# Patient Record
Sex: Female | Born: 1970 | Race: Black or African American | Hispanic: No | Marital: Married | Smoking: Never smoker
Health system: Southern US, Community
[De-identification: ages and names within clinical notes are randomized; demographics above are authoritative.]

## PROBLEM LIST (undated history)

## (undated) DIAGNOSIS — I10 Essential (primary) hypertension: Secondary | ICD-10-CM

## (undated) DIAGNOSIS — F32A Depression, unspecified: Secondary | ICD-10-CM

## (undated) DIAGNOSIS — E7439 Other disorders of intestinal carbohydrate absorption: Secondary | ICD-10-CM

## (undated) DIAGNOSIS — J45909 Unspecified asthma, uncomplicated: Secondary | ICD-10-CM

## (undated) DIAGNOSIS — J310 Chronic rhinitis: Secondary | ICD-10-CM

## (undated) DIAGNOSIS — J302 Other seasonal allergic rhinitis: Secondary | ICD-10-CM

## (undated) DIAGNOSIS — R7303 Prediabetes: Secondary | ICD-10-CM

## (undated) DIAGNOSIS — E669 Obesity, unspecified: Secondary | ICD-10-CM

## (undated) DIAGNOSIS — M1990 Unspecified osteoarthritis, unspecified site: Secondary | ICD-10-CM

## (undated) DIAGNOSIS — K219 Gastro-esophageal reflux disease without esophagitis: Secondary | ICD-10-CM

## (undated) HISTORY — DX: Depression, unspecified: F32.A

## (undated) HISTORY — DX: Chronic rhinitis: J31.0

## (undated) HISTORY — DX: Gastro-esophageal reflux disease without esophagitis: K21.9

## (undated) HISTORY — DX: Prediabetes: R73.03

## (undated) HISTORY — DX: Essential (primary) hypertension: I10

## (undated) HISTORY — DX: Other disorders of intestinal carbohydrate absorption: E74.39

## (undated) HISTORY — DX: Obesity, unspecified: E66.9

## (undated) HISTORY — PX: MYOMECTOMY: SHX85

## (undated) HISTORY — PX: WISDOM TOOTH EXTRACTION: SHX21

---

## 2006-06-21 ENCOUNTER — Other Ambulatory Visit: Admission: RE | Admit: 2006-06-21 | Discharge: 2006-06-21 | Payer: Self-pay | Admitting: Internal Medicine

## 2012-05-24 ENCOUNTER — Other Ambulatory Visit (HOSPITAL_COMMUNITY): Payer: Self-pay | Admitting: Family Medicine

## 2012-05-24 DIAGNOSIS — Z1231 Encounter for screening mammogram for malignant neoplasm of breast: Secondary | ICD-10-CM

## 2012-06-05 ENCOUNTER — Other Ambulatory Visit: Payer: Self-pay | Admitting: Family Medicine

## 2012-06-05 DIAGNOSIS — Z1231 Encounter for screening mammogram for malignant neoplasm of breast: Secondary | ICD-10-CM

## 2012-07-11 ENCOUNTER — Ambulatory Visit
Admission: RE | Admit: 2012-07-11 | Discharge: 2012-07-11 | Disposition: A | Discharge status: Home / Self Care | Payer: BC Managed Care – PPO | Source: Ambulatory Visit | Attending: Family Medicine | Admitting: Family Medicine

## 2012-07-11 DIAGNOSIS — Z1231 Encounter for screening mammogram for malignant neoplasm of breast: Secondary | ICD-10-CM

## 2012-07-13 ENCOUNTER — Emergency Department (HOSPITAL_BASED_OUTPATIENT_CLINIC_OR_DEPARTMENT_OTHER): Payer: BC Managed Care – PPO

## 2012-07-13 ENCOUNTER — Encounter (HOSPITAL_BASED_OUTPATIENT_CLINIC_OR_DEPARTMENT_OTHER): Payer: Self-pay | Admitting: *Deleted

## 2012-07-13 ENCOUNTER — Emergency Department (HOSPITAL_BASED_OUTPATIENT_CLINIC_OR_DEPARTMENT_OTHER)
Admission: EM | Admit: 2012-07-13 | Discharge: 2012-07-13 | Disposition: A | Discharge status: Home / Self Care | Payer: BC Managed Care – PPO | Attending: Emergency Medicine | Admitting: Emergency Medicine

## 2012-07-13 DIAGNOSIS — J45901 Unspecified asthma with (acute) exacerbation: Secondary | ICD-10-CM | POA: Insufficient documentation

## 2012-07-13 HISTORY — DX: Unspecified asthma, uncomplicated: J45.909

## 2012-07-13 MED ORDER — FLUTICASONE PROPIONATE 50 MCG/ACT NA SUSP: 2.0000 | Freq: Every day | NASAL | Status: DC

## 2012-07-13 MED ORDER — FLUTICASONE PROPIONATE HFA 110 MCG/ACT IN AERO: 1.0000 | INHALATION_SPRAY | Freq: Two times a day (BID) | RESPIRATORY_TRACT | Status: DC

## 2012-07-13 MED ORDER — ALBUTEROL SULFATE (5 MG/ML) 0.5% IN NEBU
INHALATION_SOLUTION | RESPIRATORY_TRACT | Status: AC
  Administered 2012-07-13: 5 mg
  Filled 2012-07-13: qty 1

## 2012-07-13 MED ORDER — IPRATROPIUM BROMIDE 0.02 % IN SOLN
RESPIRATORY_TRACT | Status: AC
  Administered 2012-07-13: 0.5 mg
  Filled 2012-07-13: qty 2.5

## 2012-07-13 MED ORDER — PREDNISONE 50 MG PO TABS: 50.0000 mg | ORAL_TABLET | Freq: Every day | ORAL | Status: DC

## 2012-07-13 NOTE — ED Provider Notes (Signed)
History  This chart was scribed for Joia Doyle Smitty Cords, MD by Shari Heritage. The patient was seen in room MH06/MH06. Patient's care was started at 1950.     CSN: [id number]  Arrival date & time 07/13/12  1845   First MD Initiated Contact with Patient 07/13/12 1950      Chief Complaint  Patient presents with  . Shortness of Breath    Patient is a 41 y.o. female presenting with shortness of breath. The history is provided by the patient. No language interpreter was used.  Shortness of Breath  The current episode started today. The onset was gradual. The problem occurs rarely. The problem has been resolved. The problem is moderate. The symptoms are relieved by beta-agonist inhalers. Nothing aggravates the symptoms. Associated symptoms include rhinorrhea, cough, shortness of breath and wheezing. Pertinent negatives include no chest pain, no chest pressure, no orthopnea, no fever and no sore throat. Associated symptoms comments: Nasal congestion. The cough is productive. Nothing relieves the cough. The rhinorrhea has been occurring continuously. There was no intake of a foreign body. She has not inhaled smoke recently. She has had no prior steroid use. Her past medical history is significant for asthma. She has been behaving normally.    HPI Comments There is associated congestion, rhinorrhea and productive cough. Patient denies chills, fever, nausea, vomiting, arm pain, shoulder pain, chest pain, edema in legs or pain in legs bilaterally. Patient states that she has been experiencing cough and congestion since Wednesday. Today, she suddenly began feeling SOB and experiencing chest tightness, but states that these symptoms are typical of her reactive airway disease. SOB was resolved in the ED with one breathing treatment. Patient also used her albuterol inhaler at home 3x PTA without relief. Patient has take no long car or plane trips. She has never smoked Wells score 0.   Patient states this is  typical of her previous asthma attacks.   PCP - Cam H[facility]   Past Medical History  Diagnosis Date  . Asthma     History reviewed. No pertinent past surgical history.  History reviewed. No pertinent family history.  History  Substance Use Topics  . Smoking status: Never Smoker   . Smokeless tobacco: Not on file  . Alcohol Use: Yes    OB History    Grav Para Term Preterm Abortions TAB SAB Ect Mult Living                  Review of Systems  Constitutional: Negative for fever.  HENT: Positive for congestion and rhinorrhea. Negative for sore throat.   Respiratory: Positive for cough, shortness of breath and wheezing. Negative for chest tightness.   Cardiovascular: Negative for chest pain, palpitations, orthopnea and leg swelling.  All other systems reviewed and are negative.    Allergies  Hydrocodone  Home Medications   Current Outpatient Rx  Name Route Sig Dispense Refill  . ALBUTEROL SULFATE HFA 108 (90 BASE) MCG/ACT IN AERS Inhalation Inhale 2 puffs into the lungs every 6 (six) hours as needed.    Kathrynn Running ESTRAD-FE BIPHAS 1 MG-10 MCG / 10 MCG PO TABS Oral Take 1 tablet by mouth daily.      BP 161/93  Pulse 112  Temp 98 F (36.7 C) (Oral)  Resp 24  Ht 5' 6.5" (1.689 m)  Wt 180 lb (81.647 kg)  BMI 28.62 kg/m2  SpO2 100%  LMP 06/22/2012  Physical Exam  Constitutional: She is oriented to person, place, and  time. She appears well-developed and well-nourished.  HENT:  Head: Normocephalic and atraumatic.  Mouth/Throat: Oropharynx is clear and moist.       Cobblestoning at the back of the throat. Postnasal drip present.  Eyes: EOM are normal. Pupils are equal, round, and reactive to light.  Neck: Normal range of motion. Neck supple. No JVD present.  Cardiovascular: Normal rate and regular rhythm.   No murmur heard. Pulmonary/Chest: Effort normal. No stridor. No respiratory distress. She has wheezes (faint wheeze). She has no  rhonchi. She has no rales.  Abdominal: Soft. Bowel sounds are normal. There is no tenderness. There is no rebound and no guarding.  Musculoskeletal: Normal range of motion. She exhibits no edema and no tenderness.  Neurological: She is alert and oriented to person, place, and time.  Skin: Skin is warm.  Psychiatric: She has a normal mood and affect. Her behavior is normal.    ED Course  Procedures (including critical care time) DIAGNOSTIC STUDIES: Oxygen Saturation is 97% on room air, adequate by my interpretation.    COORDINATION OF CARE: 8:20pm- Patient informed of current plan for treatment and evaluation and agrees with plan at this time.      Labs Reviewed - No data to display No results found.   No diagnosis found.    MDM  Well's score is 0.  Patient states these symptoms are clearly her asthma.  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  She is advised to return for chest pain, pleuritic or pressure like.  Shortness of breath or any concerns.  Follow up with PMD on Monday.  Patient verbalizes understanding and agrees to follow up     I personally performed the services described in this documentation, which was scribed in my presence. The recorded information has been reviewed and considered.    Jasmine Awe, MD 07/14/12 [phone]

## 2012-07-13 NOTE — ED Notes (Signed)
Cough-prod with greenish, yellow sputum, congestion since Wed. Increased SHOB past few hours. Chest feels tight. Tried inh without relief. BBS-rhonchi and wheezing. Diminished. Mary, RRT to triage to assess.

## 2014-12-08 ENCOUNTER — Ambulatory Visit: Payer: BLUE CROSS/BLUE SHIELD

## 2015-08-04 ENCOUNTER — Other Ambulatory Visit: Payer: Self-pay | Admitting: Obstetrics & Gynecology

## 2015-08-04 ENCOUNTER — Other Ambulatory Visit (HOSPITAL_COMMUNITY)
Admission: RE | Admit: 2015-08-04 | Discharge: 2015-08-04 | Disposition: A | Discharge status: Home / Self Care | Payer: BLUE CROSS/BLUE SHIELD | Source: Ambulatory Visit | Attending: Obstetrics & Gynecology | Admitting: Obstetrics & Gynecology

## 2015-08-04 DIAGNOSIS — Z1151 Encounter for screening for human papillomavirus (HPV): Secondary | ICD-10-CM | POA: Diagnosis not present

## 2015-08-04 DIAGNOSIS — Z01411 Encounter for gynecological examination (general) (routine) with abnormal findings: Secondary | ICD-10-CM | POA: Diagnosis present

## 2015-08-06 LAB — CYTOLOGY - PAP

## 2015-08-23 ENCOUNTER — Other Ambulatory Visit: Payer: Self-pay | Admitting: Obstetrics & Gynecology

## 2015-08-31 ENCOUNTER — Other Ambulatory Visit: Payer: Self-pay

## 2015-08-31 DIAGNOSIS — Z1231 Encounter for screening mammogram for malignant neoplasm of breast: Secondary | ICD-10-CM

## 2015-10-04 ENCOUNTER — Other Ambulatory Visit: Payer: Self-pay

## 2015-10-04 ENCOUNTER — Ambulatory Visit
Admission: RE | Admit: 2015-10-04 | Discharge: 2015-10-04 | Disposition: A | Discharge status: Home / Self Care | Payer: BLUE CROSS/BLUE SHIELD | Source: Ambulatory Visit

## 2015-10-04 DIAGNOSIS — Z1231 Encounter for screening mammogram for malignant neoplasm of breast: Secondary | ICD-10-CM

## 2016-03-20 ENCOUNTER — Encounter (HOSPITAL_COMMUNITY): Payer: Self-pay | Admitting: *Deleted

## 2016-04-06 NOTE — H&P (Signed)
45yo G0 who presents for hysteroscopy, D&C, myosure resection of uterine mass. In review, the patient reports a h/o heavy menstrual bleeding that originally was managed with OCPs until a few months ago when she was noted to have significant change in her bleeding.  In October, she was noted to have an very heavy period that lasted for over 2 weeks.  She had episodes of large clots and gushes where she would soak through super pads and require pad change every hour.  Management was performed with oral estrogen and a change in her OCPs.  As part of the work up both an [place] and TVUS were performed with the following findings: TVUS: 07/2015: 9.8 cm uterus with several small fibroids (largest 2.8cm) and 3cm complex mass within cavity. Normal ovaries. EMB 08/2015- benign proliferative endometrium To better assess the "complex mass" which initially was thought to be clot from the bleeding, an SHG was performed 6wks later for follow up.  SHG revealed : A hypoechoic mass in LUS ~ 2.5x2cm in size completely within endometrial cavity.  These findings are suggestive of either a polyp or suspected fibroid and pt wishes to proceed with surgical management as this is likely contributing to her abnormal uterine bleeding.  ROS:  CONSTITUTIONAL:  no Chills. no Fever. no Night sweats.  HEENT:  Blurrred vision no. no Double vision.  CARDIOLOGY:  no Chest pain.  RESPIRATORY:  no Shortness of breath. no Cough.  GASTROENTEROLOGY:  Abdominal pain yes, see HPI. no Appetite change. no Change in bowel movements.  FEMALE REPRODUCTIVE:  no Breast lumps or discharge. no Breast pain.  NEUROLOGY:  Dizziness yes, Occasional dizziness. no Headache. no Loss of consciousness.  PSYCHOLOGY:  no Depression. no Confusion.  SKIN:  no Rash. no Hives.  HEMATOLOGY/LYMPH:  Anemia yes. Fatigue yes. Using Blood Thinners no.        Medical History: Chronic Rhinitis, Asthma/ RAD, GYN care per [facility]).         Gyn  History:  Sexual activity currently sexually active.  Periods : see above LMP: June 9th Birth control:  Cryselle 0.3/30 Last pap smear date: 07/2015 H/O Abnormal pap smear Co2 laser for cervical dysplasia.  Denies H/O STD.        OB History:  Never been pregnant per patient.        Surgical History: Cervical laser .        Hospitalization/Major Diagnostic Procedure: None .        Family History: Father: deceased, deceased from [place] (not the same one as her brother), diagnosed with DM. Mother: alive, diagnosed with HTN. Brother 1: deceased, MVA.        Social History:  General:  Tobacco use  cigarettes: Never smoked Tobacco history last updated 08/04/2015 Alcohol: yes.  Caffeine: yes.  Exercise: yes, Generally working out 3-4 x week.  Marital Status: married.  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  no Tobacco Exposure.        Medications:  Mucinex DM Maximum Strength Ibuprofen 800 MG Tablet 1 tablet Orally once a day,  ProAir HFA(Albuterol Sulfate HFA) 108 (90 Base) MCG/ACT Aerosol Solution  Vitamin D (Cholecalciferol) 1000 UNIT Tablet 1 tablet Orally Once a day,  Vitamin B12 100 MCG Tablet Orally ,  Hair Skin & Nails Gummies(Biotin w/ Vitamins C & E) 1250-7.5-7.5 MCG-MG-UNT Tablet Chewable Orally , Taking Slow Fe(Iron) 160 (50 Fe) MG Tablet Extended Release 1 tablet Orally Once a day, Taking Zyrtec-D 12 hour(Cetirizine-Pseudoephedrine ER) 5-120 MG Tablet Extended Release  12 Hour 1 tablet Orally Twice a day, Medication List reviewed and reconciled with the patient       Allergies: Hydrocodone: Itch.    O:  SKIN: warm and dry, no rashes.  NECK: supple, normal appearance.  LUNGS: regular breathing rate and effort.  FEMALE GENITOURINARY: normal external genitalia, labia - unremarkable, vagina - pink moist mucosa, no lesions or abnormal discharge, cervix - no discharge or lesions or CMT.  MUSCULOSKELETAL no calf tenderness bilaterally.  EXTREMITIES: no edema present.   PSYCH appropriate mood and affect.   Labs:  6/13: CBC: 8.8>11.6/36.9<571  EMB 08/2015: negative, no hyperplasia or malginancy  TVUS/SHG: [place] report: 9.9cm uterus with 3 small fibroids- largest 2.8cm. Endometrium fluid fills with complex mass ~ 3.5cm in size, no BF noted. Thin endometrial walls, bilateral ovaries within normal limits.  SHG- hypoechoic mass in LUS ~ 2.5x2cm in size completely within endometrial cavity. no blood flow noted.  A/P: 45yo G0 who presents for hysteroscopy, D&C, myosure resection due to uterine mass and AUB -NPO -LR @ 125cc/hr -SCDs to OR -Risk/benefit and indications reviewed with pt- concerns were addressed and pt wishes to proceed.  Janyth Pupa, DO [phone] (pager) [phone] (office)

## 2016-04-13 ENCOUNTER — Encounter (HOSPITAL_COMMUNITY): Payer: Self-pay | Admitting: Anesthesiology

## 2016-04-14 ENCOUNTER — Encounter (HOSPITAL_COMMUNITY): Payer: Self-pay | Admitting: *Deleted

## 2016-04-14 ENCOUNTER — Ambulatory Visit (HOSPITAL_COMMUNITY)
Admission: RE | Admit: 2016-04-14 | Discharge: 2016-04-14 | Disposition: A | Discharge status: Home / Self Care | Payer: BLUE CROSS/BLUE SHIELD | Source: Ambulatory Visit | Attending: Obstetrics & Gynecology | Admitting: Obstetrics & Gynecology

## 2016-04-14 ENCOUNTER — Ambulatory Visit (HOSPITAL_COMMUNITY): Payer: BLUE CROSS/BLUE SHIELD | Admitting: Anesthesiology

## 2016-04-14 ENCOUNTER — Encounter (HOSPITAL_COMMUNITY)
Admission: RE | Disposition: A | Discharge status: Home / Self Care | Payer: Self-pay | Source: Ambulatory Visit | Attending: Obstetrics & Gynecology

## 2016-04-14 DIAGNOSIS — D259 Leiomyoma of uterus, unspecified: Secondary | ICD-10-CM | POA: Diagnosis not present

## 2016-04-14 DIAGNOSIS — N938 Other specified abnormal uterine and vaginal bleeding: Secondary | ICD-10-CM | POA: Insufficient documentation

## 2016-04-14 DIAGNOSIS — J45909 Unspecified asthma, uncomplicated: Secondary | ICD-10-CM | POA: Diagnosis not present

## 2016-04-14 HISTORY — DX: Unspecified osteoarthritis, unspecified site: M19.90

## 2016-04-14 HISTORY — DX: Other seasonal allergic rhinitis: J30.2

## 2016-04-14 HISTORY — PX: DILATATION & CURETTAGE/HYSTEROSCOPY WITH MYOSURE: SHX6511

## 2016-04-14 LAB — CBC
HCT: 33.8 % — ABNORMAL LOW (ref 36.0–46.0)
Hemoglobin: 10.8 g/dL — ABNORMAL LOW (ref 12.0–15.0)
MCH: 24.7 pg — AB (ref 26.0–34.0)
MCHC: 32 g/dL (ref 30.0–36.0)
MCV: 77.3 fL — ABNORMAL LOW (ref 78.0–100.0)
PLATELETS: 678 10*3/uL — AB (ref 150–400)
RBC: 4.37 MIL/uL (ref 3.87–5.11)
RDW: 14.5 % (ref 11.5–15.5)
WBC: 10.2 10*3/uL (ref 4.0–10.5)

## 2016-04-14 LAB — BASIC METABOLIC PANEL
ANION GAP: 10 (ref 5–15)
BUN: 10 mg/dL (ref 6–20)
CALCIUM: 8.9 mg/dL (ref 8.9–10.3)
CO2: 22 mmol/L (ref 22–32)
Chloride: 104 mmol/L (ref 101–111)
Creatinine, Ser: 0.73 mg/dL (ref 0.44–1.00)
GFR calc non Af Amer: >60 mL/min (ref >60)
GLUCOSE: 107 mg/dL — AB (ref 65–99)
POTASSIUM: 3.6 mmol/L (ref 3.5–5.1)
Sodium: 136 mmol/L (ref 135–145)

## 2016-04-14 LAB — PREGNANCY, URINE: Preg Test, Ur: NEGATIVE

## 2016-04-14 SURGERY — DILATATION & CURETTAGE/HYSTEROSCOPY WITH MYOSURE
Anesthesia: General | Site: Vagina

## 2016-04-14 MED ORDER — LIDOCAINE HCL (CARDIAC) 20 MG/ML IV SOLN
INTRAVENOUS | Status: DC | PRN
  Administered 2016-04-14: 100 mg via INTRAVENOUS

## 2016-04-14 MED ORDER — SCOPOLAMINE 1 MG/3DAYS TD PT72
MEDICATED_PATCH | TRANSDERMAL | Status: AC
  Filled 2016-04-14: qty 1

## 2016-04-14 MED ORDER — ACETAMINOPHEN 160 MG/5ML PO SOLN
975.0000 mg | Freq: Once | ORAL | Status: AC
  Administered 2016-04-14: 975 mg via ORAL

## 2016-04-14 MED ORDER — KETOROLAC TROMETHAMINE 30 MG/ML IJ SOLN
INTRAMUSCULAR | Status: DC | PRN
  Administered 2016-04-14: 30 mg via INTRAVENOUS

## 2016-04-14 MED ORDER — FENTANYL CITRATE (PF) 250 MCG/5ML IJ SOLN
INTRAMUSCULAR | Status: DC | PRN
  Administered 2016-04-14 (×5): 50 ug via INTRAVENOUS

## 2016-04-14 MED ORDER — DEXAMETHASONE SODIUM PHOSPHATE 10 MG/ML IJ SOLN
INTRAMUSCULAR | Status: AC
  Filled 2016-04-14: qty 1

## 2016-04-14 MED ORDER — SCOPOLAMINE 1 MG/3DAYS TD PT72
1.0000 | MEDICATED_PATCH | Freq: Once | TRANSDERMAL | Status: DC
  Administered 2016-04-14: 1.5 mg via TRANSDERMAL

## 2016-04-14 MED ORDER — KETOROLAC TROMETHAMINE 30 MG/ML IJ SOLN
INTRAMUSCULAR | Status: AC
  Filled 2016-04-14: qty 1

## 2016-04-14 MED ORDER — PROPOFOL 10 MG/ML IV BOLUS
INTRAVENOUS | Status: AC
  Filled 2016-04-14: qty 20

## 2016-04-14 MED ORDER — PROPOFOL 10 MG/ML IV BOLUS
INTRAVENOUS | Status: DC | PRN
  Administered 2016-04-14: 200 mg via INTRAVENOUS

## 2016-04-14 MED ORDER — VASOPRESSIN 20 UNIT/ML IV SOLN
INTRAVENOUS | Status: DC | PRN
  Administered 2016-04-14: 35 mL via INTRAMUSCULAR

## 2016-04-14 MED ORDER — ACETAMINOPHEN 160 MG/5ML PO SOLN
ORAL | Status: AC
  Filled 2016-04-14: qty 40.6

## 2016-04-14 MED ORDER — MIDAZOLAM HCL 2 MG/2ML IJ SOLN
INTRAMUSCULAR | Status: AC
  Filled 2016-04-14: qty 2

## 2016-04-14 MED ORDER — LACTATED RINGERS IV SOLN: INTRAVENOUS | Status: DC

## 2016-04-14 MED ORDER — ONDANSETRON HCL 4 MG/2ML IJ SOLN
INTRAMUSCULAR | Status: DC | PRN
  Administered 2016-04-14: 4 mg via INTRAVENOUS

## 2016-04-14 MED ORDER — GLYCOPYRROLATE 0.2 MG/ML IJ SOLN
INTRAMUSCULAR | Status: DC | PRN
  Administered 2016-04-14: 0.1 mg via INTRAVENOUS

## 2016-04-14 MED ORDER — LACTATED RINGERS IV SOLN
INTRAVENOUS | Status: DC
  Administered 2016-04-14: 09:00:00 via INTRAVENOUS

## 2016-04-14 MED ORDER — HYDROMORPHONE HCL 1 MG/ML IJ SOLN
INTRAMUSCULAR | Status: AC
  Filled 2016-04-14: qty 1

## 2016-04-14 MED ORDER — LIDOCAINE HCL 2 % EX GEL
CUTANEOUS | Status: AC
  Filled 2016-04-14: qty 5

## 2016-04-14 MED ORDER — SODIUM CHLORIDE 0.9 % IR SOLN
Status: DC | PRN
Start: 2016-04-14 — End: 2016-04-14
  Administered 2016-04-14: 3000 mL

## 2016-04-14 MED ORDER — ONDANSETRON HCL 4 MG/2ML IJ SOLN
INTRAMUSCULAR | Status: AC
  Filled 2016-04-14: qty 2

## 2016-04-14 MED ORDER — VASOPRESSIN 20 UNIT/ML IV SOLN
INTRAVENOUS | Status: AC
  Filled 2016-04-14: qty 1

## 2016-04-14 MED ORDER — DEXAMETHASONE SODIUM PHOSPHATE 4 MG/ML IJ SOLN
INTRAMUSCULAR | Status: AC
  Filled 2016-04-14: qty 1

## 2016-04-14 MED ORDER — FENTANYL CITRATE (PF) 100 MCG/2ML IJ SOLN: 25.0000 ug | INTRAMUSCULAR | Status: DC | PRN

## 2016-04-14 MED ORDER — MIDAZOLAM HCL 2 MG/2ML IJ SOLN
INTRAMUSCULAR | Status: DC | PRN
  Administered 2016-04-14: 2 mg via INTRAVENOUS

## 2016-04-14 MED ORDER — DEXAMETHASONE SODIUM PHOSPHATE 4 MG/ML IJ SOLN
INTRAMUSCULAR | Status: DC | PRN
  Administered 2016-04-14: 4 mg via INTRAVENOUS

## 2016-04-14 MED ORDER — LIDOCAINE HCL (CARDIAC) 20 MG/ML IV SOLN
INTRAVENOUS | Status: AC
  Filled 2016-04-14: qty 5

## 2016-04-14 MED ORDER — SODIUM CHLORIDE 0.9 % IJ SOLN
INTRAMUSCULAR | Status: AC
  Filled 2016-04-14: qty 100

## 2016-04-14 MED ORDER — FENTANYL CITRATE (PF) 250 MCG/5ML IJ SOLN
INTRAMUSCULAR | Status: AC
  Filled 2016-04-14: qty 5

## 2016-04-14 SURGICAL SUPPLY — 19 items
CANISTERS HI-FLOW 3000CC (CANNISTER) ×3 IMPLANT
CATH ROBINSON RED A/P 16FR (CATHETERS) ×3 IMPLANT
CLOTH BEACON ORANGE TIMEOUT ST (SAFETY) ×3 IMPLANT
CONTAINER PREFILL 10% NBF 60ML (FORM) ×6 IMPLANT
DEVICE MYOSURE LITE (MISCELLANEOUS) IMPLANT
DEVICE MYOSURE REACH (MISCELLANEOUS) IMPLANT
DILATOR CANAL MILEX (MISCELLANEOUS) IMPLANT
GLOVE BIOGEL PI IND STRL 6.5 (GLOVE) ×2 IMPLANT
GLOVE BIOGEL PI IND STRL 7.0 (GLOVE) ×1 IMPLANT
GLOVE BIOGEL PI INDICATOR 6.5 (GLOVE) ×4
GLOVE BIOGEL PI INDICATOR 7.0 (GLOVE) ×2
GLOVE ECLIPSE 6.5 STRL STRAW (GLOVE) ×3 IMPLANT
GOWN STRL REUS W/TWL LRG LVL3 (GOWN DISPOSABLE) ×6 IMPLANT
MYOSURE XL FIBROID REM (MISCELLANEOUS) ×3
PACK VAGINAL MINOR WOMEN LF (CUSTOM PROCEDURE TRAY) ×3 IMPLANT
PAD OB MATERNITY 4.3X12.25 (PERSONAL CARE ITEMS) ×3 IMPLANT
SEAL ROD LENS SCOPE MYOSURE (ABLATOR) ×6 IMPLANT
SYSTEM TISS REMOVAL MYSR XL RM (MISCELLANEOUS) ×1 IMPLANT
TOWEL OR 17X24 6PK STRL BLUE (TOWEL DISPOSABLE) ×6 IMPLANT

## 2016-04-14 NOTE — Interval H&P Note (Signed)
History and Physical Interval Note:  04/14/2016 9:36 AM  Miranda Hayden  has presented today for surgery, with the diagnosis of fibroid  The various methods of treatment have been discussed with the patient and family. After consideration of risks, benefits and other options for treatment, the patient has consented to  Procedure(s) with comments: [place] (N/A) - request case for Otila Kluver and Luellen Pucker and Elberta Leatherwood [facility]) confirmed and Mirena insertion as a surgical intervention .  The patient's history has been reviewed, patient examined, no change in status, stable for surgery.  I have reviewed the patient's chart and labs.  Questions were answered to the patient's satisfaction.     Janyth Pupa, M

## 2016-04-14 NOTE — Op Note (Signed)
Operative Report  PreOp: abnormal uterine bleeding, intracavitary uterine fibroid PostOp: same Procedure:  Hysteroscopy, Dilation and Curettage, myosure resection, Mirena replacement Surgeon: Dr. Janyth Pupa Anesthesia: General Complications:none EBL: [id number] UOP: 30cc IVF:1000cc Deficit: 310cc  Findings:9cm anteverted uterus with 2.5cm uterine fibroid within the cavity, both ostia visualized  Specimens: 1) endometrial fibroid 2) endometrial curettings   Procedure: The patient was taken to the operating room where she underwent general anesthesia without difficulty. The patient was placed in a low lithotomy position using Allen stirrups. The patient was examined with the findings as noted above.  She was then prepped and draped in the normal sterile fashion. The bladder was drained using a red rubber urethral catheter. A sterile speculum was inserted into the vagina. A single tooth tenaculum was placed on the anterior lip of the cervix. ~10cc of dilute vasopressin was injected into the cervix at 10, 2 and 6 o'clock position.  The uterus was then sounded to 9cm. The endocervical canal was then serially dilated to 20French using Hank dilators.  The diagnostic hysteroscope was then inserted without difficulty and noted to have the findings as listed above. Visualization was achieved using NS as a distending medium. The myosure was used for full resection of the intracavitary mass.  The hysteroscope was removed and sharp curettage was performed. The tissue was sent to pathology. The hysteroscope was reinserted no uterine perforations were seen and the cavity appeared normal. All instrument were then removed. Hemostasis was observed at the cervical site. The patient was repositioned to the supine position. The patient tolerated the procedure without any complications and taken to recovery in stable condition.   Janyth Pupa, DO [phone] (pager) [phone] (office)

## 2016-04-14 NOTE — Anesthesia Postprocedure Evaluation (Signed)
Anesthesia Post Note  Patient: Miranda Hayden  Procedure(s) Performed: Procedure(s) (LRB): DILATATION & CURETTAGE/HYSTEROSCOPY WITH MYOSURE (N/A)  Patient location during evaluation: PACU Anesthesia Type: General Level of consciousness: awake and alert Pain management: pain level controlled Vital Signs Assessment: post-procedure vital signs reviewed and stable Respiratory status: spontaneous breathing, nonlabored ventilation, respiratory function stable and patient connected to nasal cannula oxygen Cardiovascular status: blood pressure returned to baseline and stable Postop Assessment: no signs of nausea or vomiting Anesthetic complications: no     Last Vitals:  Filed Vitals:   04/14/16 1145 04/14/16 1220  BP: 142/81 154/98  Pulse: 75 79  Temp: 36.5 C 36.3 C  Resp: 24 20    Last Pain:  Filed Vitals:   04/14/16 1232  PainSc: 2    Pain Goal: Patients Stated Pain Goal: 3 (04/14/16 1220)               Tiajuana Amass

## 2016-04-14 NOTE — Anesthesia Procedure Notes (Signed)
Procedure Name: LMA Insertion Date/Time: 04/14/2016 9:58 AM Performed by: Flossie Dibble Pre-anesthesia Checklist: Patient identified, Timeout performed, Emergency Drugs available, Suction available and Patient being monitored Patient Re-evaluated:Patient Re-evaluated prior to inductionOxygen Delivery Method: Circle system utilized Preoxygenation: Pre-oxygenation with 100% oxygen Intubation Type: IV induction LMA: LMA inserted LMA Size: 4.0 Number of attempts: 1 Placement Confirmation: breath sounds checked- equal and bilateral and positive ETCO2 Tube secured with: Tape Dental Injury: Teeth and Oropharynx as per pre-operative assessment

## 2016-04-14 NOTE — Anesthesia Preprocedure Evaluation (Signed)
Anesthesia Evaluation  Patient identified by MRN, date of birth, ID band Patient awake    Reviewed: Allergy & Precautions, H&P , Patient's Chart, lab work & pertinent test results, reviewed documented beta blocker date and time   Airway Mallampati: II  TM Distance: >3 FB Neck ROM: full    Dental no notable dental hx.    Pulmonary asthma ,    Pulmonary exam normal breath sounds clear to auscultation       Cardiovascular  Rhythm:regular Rate:Normal     Neuro/Psych    GI/Hepatic   Endo/Other    Renal/GU      Musculoskeletal   Abdominal   Peds  Hematology   Anesthesia Other Findings   Reproductive/Obstetrics                             Anesthesia Physical Anesthesia Plan  ASA: II  Anesthesia Plan:    Post-op Pain Management:    Induction: Intravenous  Airway Management Planned: LMA  Additional Equipment:   Intra-op Plan:   Post-operative Plan:   Informed Consent: I have reviewed the patients History and Physical, chart, labs and discussed the procedure including the risks, benefits and alternatives for the proposed anesthesia with the patient or authorized representative who has indicated his/her understanding and acceptance.   Dental Advisory Given and Dental advisory given  Plan Discussed with: CRNA and Surgeon  Anesthesia Plan Comments: (Discussed GA with LMA, possible sore throat, potential need to switch to ETT, N/V, pulmonary aspiration. Questions answered. )        Anesthesia Quick Evaluation

## 2016-04-14 NOTE — Progress Notes (Deleted)
Operative Report  PreOp: abnormal uterine bleeding, intracavitary uterine fibroid PostOp: same Procedure:  Hysteroscopy, Dilation and Curettage, myosure resection, Mirena replacement Surgeon: Dr. Janyth Pupa Anesthesia: General Complications:none EBL: [id number] UOP: 30cc IVF:1000cc Deficit: 310cc  Findings:9cm anteverted uterus with 2.5cm uterine fibroid within the cavity, both ostia visualized  Specimens: 1) endometrial fibroid 2) endometrial curettings   Procedure: The patient was taken to the operating room where she underwent general anesthesia without difficulty. The patient was placed in a low lithotomy position using Allen stirrups. The patient was examined with the findings as noted above.  She was then prepped and draped in the normal sterile fashion. The bladder was drained using a red rubber urethral catheter. A sterile speculum was inserted into the vagina. A single tooth tenaculum was placed on the anterior lip of the cervix. ~10cc of dilute vasopressin was injected into the cervix at 10, 2 and 6 o'clock position.  The uterus was then sounded to 9cm. The endocervical canal was then serially dilated to 20French using Hank dilators.  The diagnostic hysteroscope was then inserted without difficulty and noted to have the findings as listed above. Visualization was achieved using NS as a distending medium. The myosure was used for full resection of the intracavitary mass.  The hysteroscope was removed and sharp curettage was performed. The tissue was sent to pathology. The hysteroscope was reinserted no uterine perforations were seen and the cavity appeared normal. All instrument were then removed. Hemostasis was observed at the cervical site. The patient was repositioned to the supine position. The patient tolerated the procedure without any complications and taken to recovery in stable condition.   Janyth Pupa, DO [phone] (pager) [phone] (office)

## 2016-04-14 NOTE — Discharge Instructions (Addendum)
HOME INSTRUCTIONS  Please note any unusual or excessive bleeding, pain, swelling. Mild dizziness or drowsiness are normal for about 24 hours after surgery.   Shower when comfortable  Restrictions: No driving for 24 hours or while taking pain medications.  Activity:  No heavy lifting (> 10 lbs), nothing in vagina (no tampons, douching, or intercourse) x 2 weeks; no tub baths for 2 weeks Vaginal spotting is expected but if your bleeding is heavy, period like,  please call the office  Diet:  You may return to your regular diet.  Do not eat large meals.  Eat small frequent meals throughout the day.  Continue to drink a good amount of water at least 6-8 glasses of water per day, hydration is very important for the healing process.  Pain Management: Take Motrin and/or Percocet as prescribed/needed for pain.  Always take prescription pain medication with food, it may cause constipation, increase fluids and fiber and you may want to take an over-the-counter stool softener like Colace as needed up to 2x a day.    Alcohol -- Avoid for 24 hours and while taking pain medications.  Nausea: Take sips of ginger ale or soda  Fever -- Call physician if temperature over 101 degrees  Follow up:  If you do not already have a follow up appointment scheduled, please call the office at [phone].  If you experience fever (a temperature greater than 100.4), pain unrelieved by pain medication, shortness of breath, swelling of a single leg, or any other symptoms which are concerning to you please the office immediately.  May take Ibuprofen after 4:30pm 04/14/16.  May remove patch behind ear on or before 04/17/16.  Wash hands with soap and water after contact with the patch.

## 2016-04-14 NOTE — Transfer of Care (Signed)
Immediate Anesthesia Transfer of Care Note  Patient: Miranda Hayden  Procedure(s) Performed: Procedure(s) with comments: [place] (N/A) - request case for Otila Kluver and Audrey and Elberta Leatherwood [facility]) confirmed   Patient Location: PACU  Anesthesia Type:General  Level of Consciousness: awake, alert  and oriented  Airway & Oxygen Therapy: Patient Spontanous Breathing and Patient connected to nasal cannula oxygen  Post-op Assessment: Report given to RN and Post -op Vital signs reviewed and stable  Post vital signs: Reviewed and stable  Last Vitals:  Filed Vitals:   04/14/16 0829 04/14/16 0907  BP: 169/90 149/88  Pulse: 98   Temp: 36.5 C   Resp: 18     Last Pain: There were no vitals filed for this visit.    Patients Stated Pain Goal: 3 ([id number])  Complications: No apparent anesthesia complications

## 2016-04-17 ENCOUNTER — Encounter (HOSPITAL_COMMUNITY): Payer: Self-pay | Admitting: Obstetrics & Gynecology

## 2018-02-11 ENCOUNTER — Other Ambulatory Visit: Payer: Self-pay | Admitting: Obstetrics & Gynecology

## 2018-02-11 DIAGNOSIS — Z1231 Encounter for screening mammogram for malignant neoplasm of breast: Secondary | ICD-10-CM

## 2018-03-07 ENCOUNTER — Ambulatory Visit
Admission: RE | Admit: 2018-03-07 | Discharge: 2018-03-07 | Disposition: A | Discharge status: Home / Self Care | Payer: PRIVATE HEALTH INSURANCE | Source: Ambulatory Visit | Attending: Obstetrics & Gynecology | Admitting: Obstetrics & Gynecology

## 2018-03-07 DIAGNOSIS — Z1231 Encounter for screening mammogram for malignant neoplasm of breast: Secondary | ICD-10-CM

## 2019-09-26 ENCOUNTER — Other Ambulatory Visit: Payer: Self-pay | Admitting: Physician Assistant

## 2019-09-26 DIAGNOSIS — Z1231 Encounter for screening mammogram for malignant neoplasm of breast: Secondary | ICD-10-CM

## 2019-09-27 ENCOUNTER — Ambulatory Visit
Admission: RE | Admit: 2019-09-27 | Discharge: 2019-09-27 | Disposition: A | Discharge status: Home / Self Care | Payer: PRIVATE HEALTH INSURANCE | Source: Ambulatory Visit | Attending: Physician Assistant | Admitting: Physician Assistant

## 2019-09-27 ENCOUNTER — Other Ambulatory Visit: Payer: Self-pay

## 2019-09-27 DIAGNOSIS — Z1231 Encounter for screening mammogram for malignant neoplasm of breast: Secondary | ICD-10-CM

## 2020-07-13 IMAGING — MG DIGITAL SCREENING BILAT W/ TOMO W/ CAD
8 series · 8 of 24 positions shown · non-contrast
Comparison: Previous exam(s).

CLINICAL DATA: Screening.

EXAM:
DIGITAL SCREENING BILATERAL MAMMOGRAM WITH TOMO AND CAD

[R CC synth-2D]
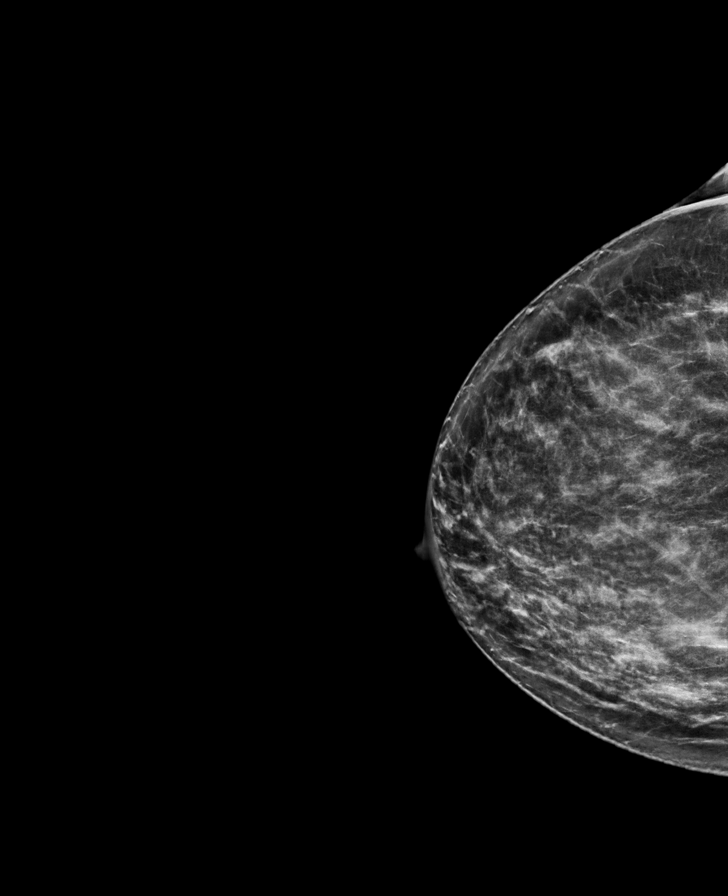

[R MLO synth-2D]
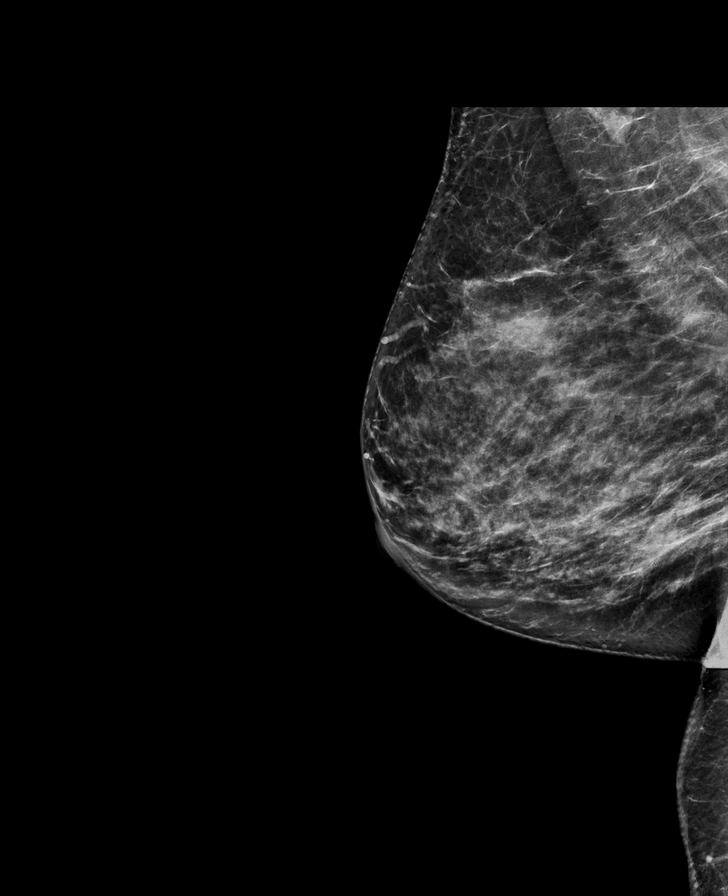

[L CC synth-2D]
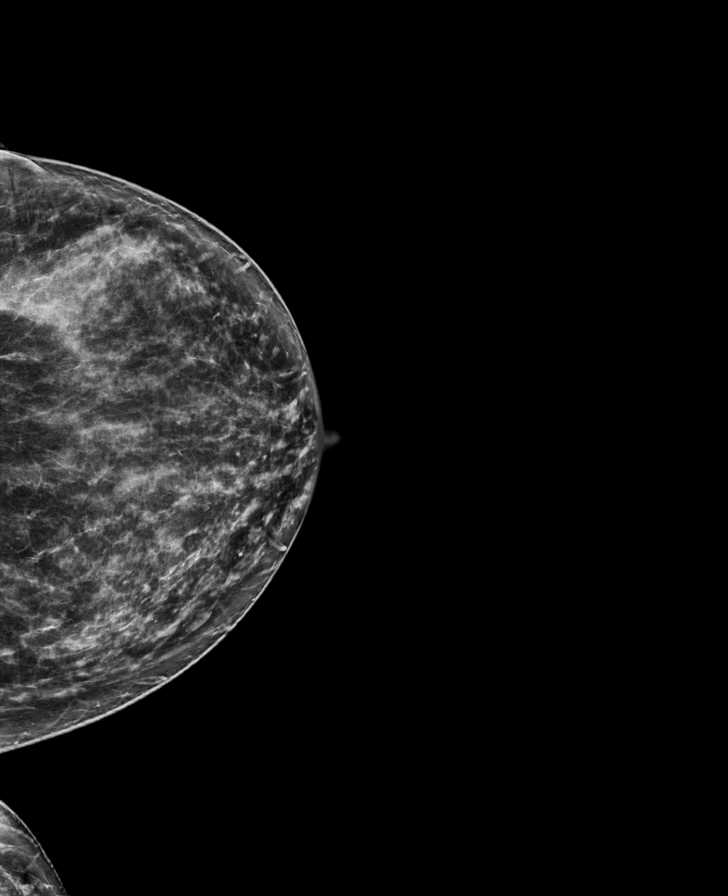

[L MLO synth-2D]
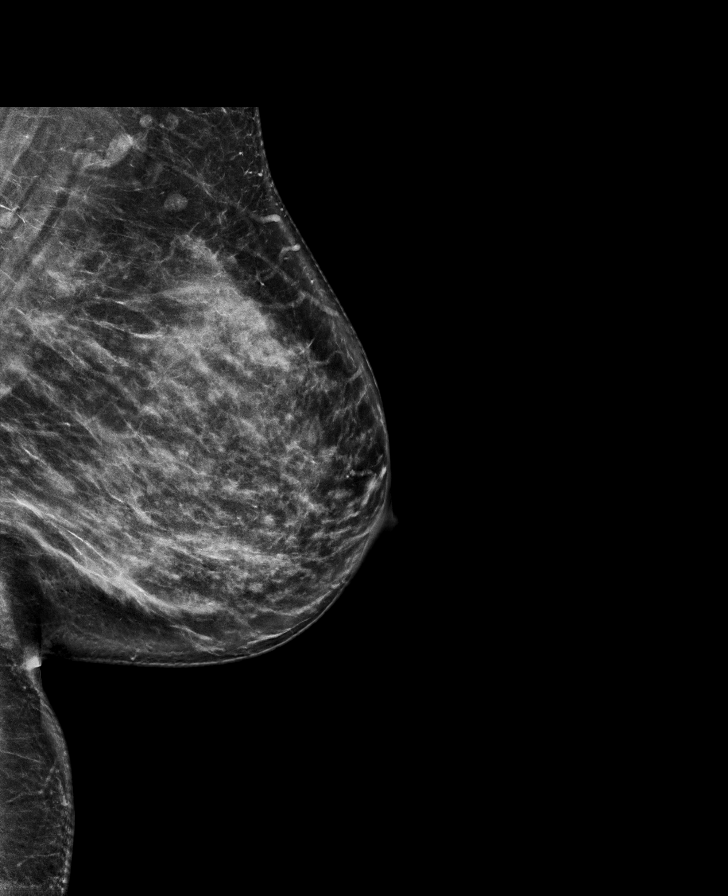

[R CC tomo · tomo slice 39/76.0]
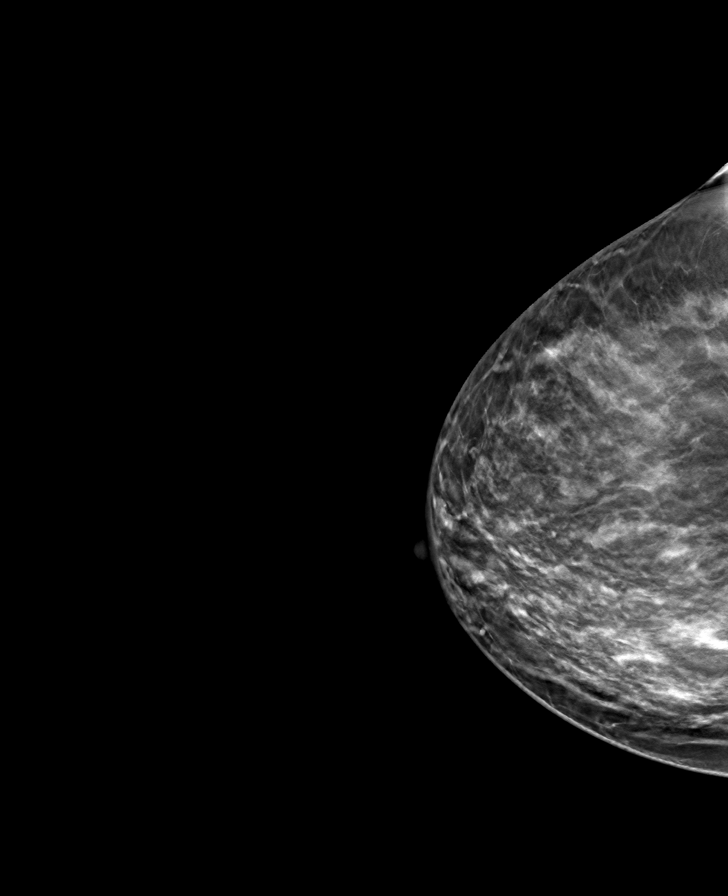

[L MLO tomo · tomo slice 43/86.0]
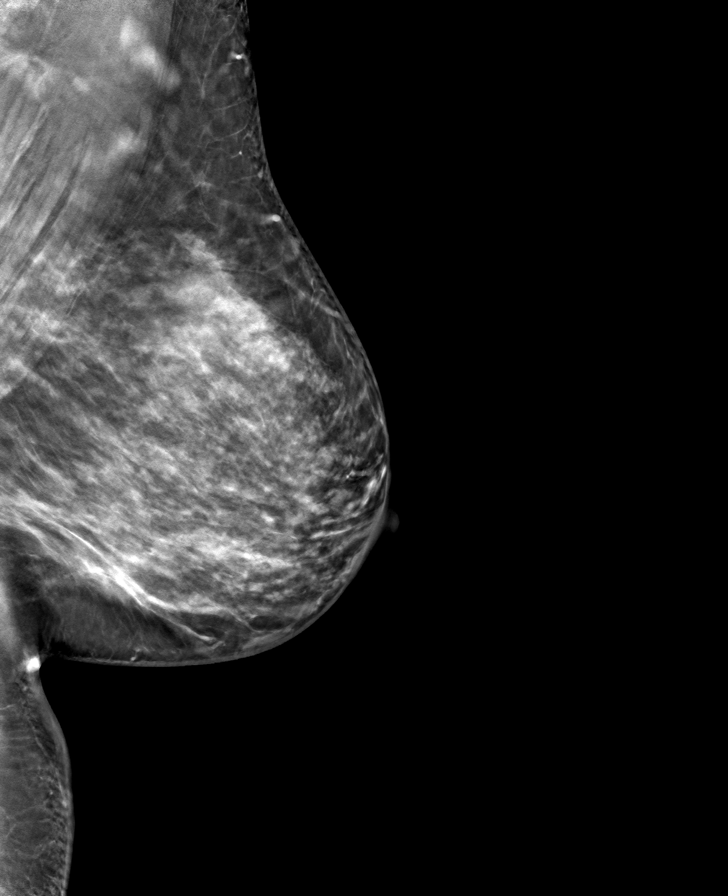

[L CC tomo · tomo slice 39/77.0]
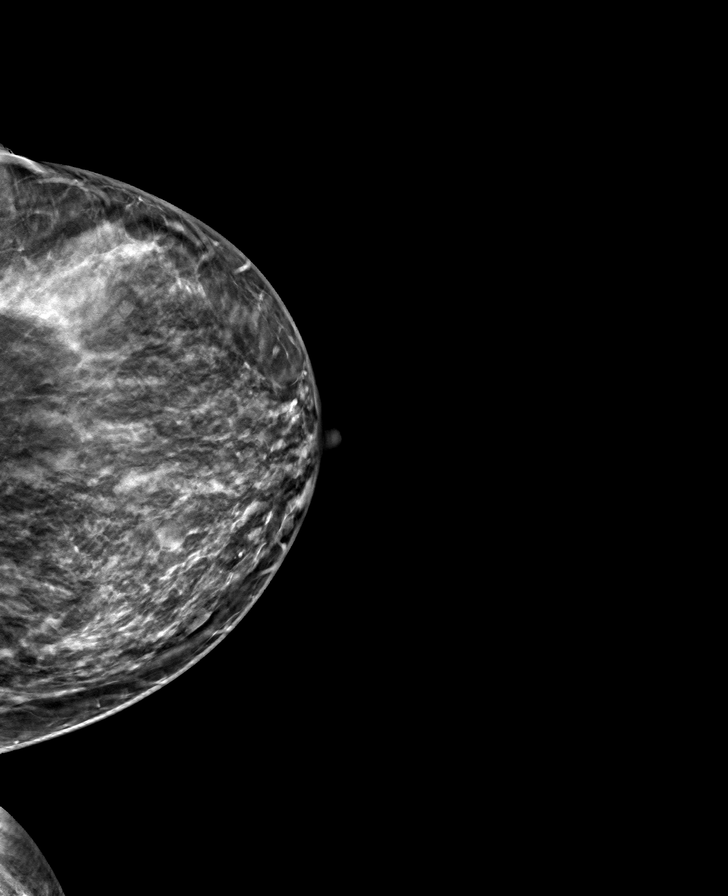

[R MLO tomo · tomo slice 42/83.0]
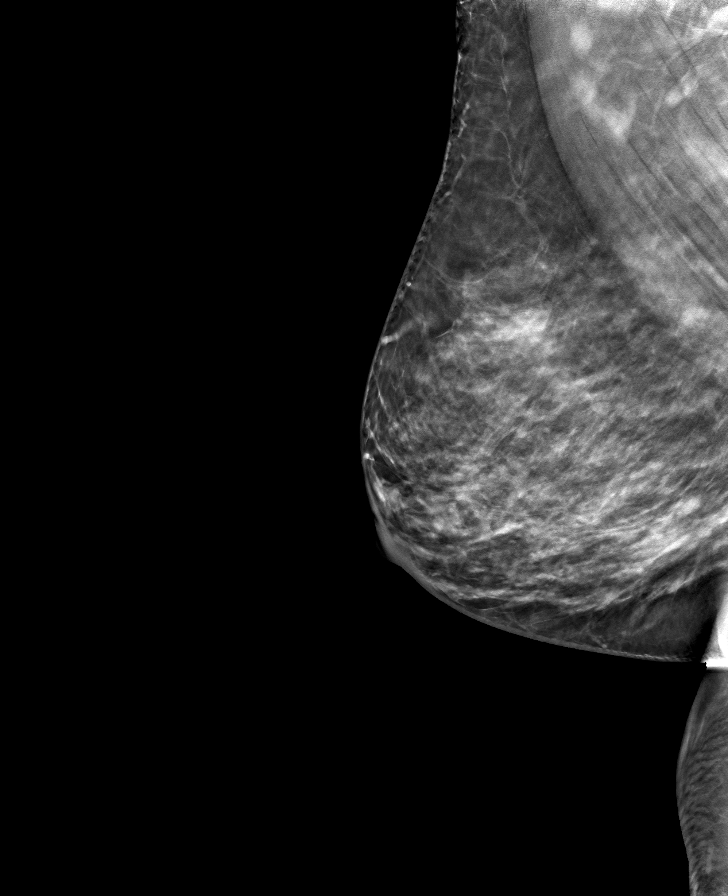

[8 of 24 positions shown; findings below may reference images not displayed]

ACR Breast Density Category c: The breast tissue is heterogeneously
dense, which may obscure small masses.
FINDINGS: There are no findings suspicious for malignancy. Images were
processed with CAD.
IMPRESSION: No mammographic evidence of malignancy. A result letter of this
screening mammogram will be mailed directly to the patient.

RECOMMENDATION:
Screening mammogram in one year. (Code:[id number]-[id number])

BI-RADS CATEGORY  1: Negative.

## 2020-07-20 ENCOUNTER — Other Ambulatory Visit (HOSPITAL_BASED_OUTPATIENT_CLINIC_OR_DEPARTMENT_OTHER): Payer: Self-pay | Admitting: Physician Assistant

## 2020-07-20 DIAGNOSIS — Z8249 Family history of ischemic heart disease and other diseases of the circulatory system: Secondary | ICD-10-CM

## 2020-07-20 DIAGNOSIS — H93A9 Pulsatile tinnitus, unspecified ear: Secondary | ICD-10-CM

## 2020-07-31 ENCOUNTER — Ambulatory Visit (HOSPITAL_BASED_OUTPATIENT_CLINIC_OR_DEPARTMENT_OTHER): Payer: PRIVATE HEALTH INSURANCE

## 2021-03-24 ENCOUNTER — Other Ambulatory Visit (HOSPITAL_COMMUNITY): Payer: Self-pay

## 2021-07-21 ENCOUNTER — Other Ambulatory Visit: Payer: Self-pay | Admitting: Physician Assistant

## 2021-07-21 DIAGNOSIS — Z1231 Encounter for screening mammogram for malignant neoplasm of breast: Secondary | ICD-10-CM

## 2021-08-23 ENCOUNTER — Ambulatory Visit: Payer: PRIVATE HEALTH INSURANCE

## 2022-07-10 ENCOUNTER — Encounter: Payer: Self-pay | Admitting: Interventional Cardiology

## 2022-07-10 ENCOUNTER — Ambulatory Visit
Payer: No Typology Code available for payment source | Attending: Interventional Cardiology | Admitting: Interventional Cardiology

## 2022-07-10 VITALS — BP 130/84 | HR 89 | Ht 66.0 in | Wt 217.2 lb

## 2022-07-10 DIAGNOSIS — Z8249 Family history of ischemic heart disease and other diseases of the circulatory system: Secondary | ICD-10-CM | POA: Diagnosis not present

## 2022-07-10 DIAGNOSIS — R0683 Snoring: Secondary | ICD-10-CM

## 2022-07-10 DIAGNOSIS — I10 Essential (primary) hypertension: Secondary | ICD-10-CM | POA: Diagnosis not present

## 2022-07-10 DIAGNOSIS — R9431 Abnormal electrocardiogram [ECG] [EKG]: Secondary | ICD-10-CM | POA: Diagnosis not present

## 2022-07-10 DIAGNOSIS — R7303 Prediabetes: Secondary | ICD-10-CM | POA: Diagnosis not present

## 2022-07-10 DIAGNOSIS — E785 Hyperlipidemia, unspecified: Secondary | ICD-10-CM | POA: Diagnosis not present

## 2022-07-10 NOTE — Patient Instructions (Signed)
Medication Instructions:  Your physician recommends that you continue on your current medications as directed. Please refer to the Current Medication list given to you today.  *If you need a refill on your cardiac medications before your next appointment, please call your pharmacy*   Testing/Procedures: ECHO Your physician has requested that you have an echocardiogram. Echocardiography is a painless test that uses sound waves to create images of your heart. It provides your doctor with information about the size and shape of your heart and how well your heart's chambers and valves are working. This procedure takes approximately one hour. There are no restrictions for this procedure.   Calcium CT score Your physician has requested that you have a calcium score CT scan. There is a $99 fee for the scan.     Follow-Up: At [facility], you and your health needs are our priority.  As part of our continuing mission to provide you with exceptional heart care, we have created designated Provider Care Teams.  These Care Teams include your primary Cardiologist (physician) and Advanced Practice Providers (APPs -  Physician Assistants and Nurse Practitioners) who all work together to provide you with the care you need, when you need it.  We recommend signing up for the patient portal called "MyChart".  Sign up information is provided on this After Visit Summary.  MyChart is used to connect with patients for Virtual Visits (Telemedicine).  Patients are able to view lab/test results, encounter notes, upcoming appointments, etc.  Non-urgent messages can be sent to your provider as well.   To learn more about what you can do with MyChart, go to [email or web address].    Your next appointment:   2 months  The format for your next appointment:   In Person  Provider:   Dr. Tamala Julian   Other Instructions  Sleep study at [facility] with script

## 2022-07-10 NOTE — Progress Notes (Signed)
Cardiology Office Note:    Date:  07/10/2022   ID:  [name], DOB 1971-08-13, MRN [id number]  PCP:  Mayra Neer, MD  Cardiologist:  None   Referring MD: Alycia Rossetti, MD   Chief Complaint  Patient presents with   Advice Only    Prediabetes Abnormal EKG Hypertension Hyperlipidemia Family history of CAD    History of Present Illness:    Miranda Hayden is a 51 y.o. female with a hx of GERD, primary hypertension, asthma, prediabetes, family history CAD, who is being seen for abnormal ECG.  Records suggest left ventricular hypertrophy, T wave abnormality, and left atrial abnormality: EKG in the setting of hypertension.  Dr. Landry Mellow is asymptomatic.  She is referred because of an abnormal EKG.  In going through her history she has multiple risk factors including primary hypertension, prediabetes, family history of CAD (uncle, first cousins, with vascular events in their late 66s to early 53s.  Also has hyperlipidemia that is untreated.  Past Medical History:  Diagnosis Date   Arthritis    knees   Asthma    rarely uses inhaler   Depression    GERD (gastroesophageal reflux disease)    Glucose intolerance    HTN (hypertension)    Obesity    Prediabetes    Rhinitis    Seasonal allergies     Past Surgical History:  Procedure Laterality Date   DILATATION & CURETTAGE/HYSTEROSCOPY WITH MYOSURE N/A 04/14/2016   Procedure: DILATATION & CURETTAGE/HYSTEROSCOPY WITH MYOSURE;  Surgeon: Janyth Pupa, DO;  Location: [facility];  Service: Gynecology;  Laterality: N/A;  request case for Otila Kluver and Luellen Pucker and [facility]) confirmed    MYOMECTOMY     WISDOM TOOTH EXTRACTION      Current Medications: Current Meds  Medication Sig   albuterol (PROVENTIL HFA;VENTOLIN HFA) 108 (90 BASE) MCG/ACT inhaler Inhale 2 puffs into the lungs every 6 (six) hours as needed for wheezing or shortness of breath.    BIOTIN PO Take 1 tablet by mouth daily.   celecoxib (CELEBREX) 100 MG capsule Take  100 mg by mouth 2 (two) times daily as needed.   cetirizine (ZYRTEC) 10 MG tablet Take 10 mg by mouth as needed for allergies.   Cholecalciferol (VITAMIN D-3 PO) Take 1 capsule by mouth daily.   ibuprofen (ADVIL,MOTRIN) 800 MG tablet Take 800 mg by mouth every 8 (eight) hours as needed for moderate pain or cramping.    losartan (COZAAR) 50 MG tablet Take 50 mg by mouth daily.   MAGNESIUM OXIDE PO Take 400 mg by mouth daily at 6 (six) AM.   Multiple Vitamins-Minerals (MULTIVITAMIN PO) Take 1 tablet by mouth daily.   OZEMPIC, 0.25 OR 0.5 MG/DOSE, 2 MG/3ML SOPN Inject 0.5 mg into the skin once a week. Pt takes on Fridays.   vitamin B-12 (CYANOCOBALAMIN) 500 MCG tablet Take 500 mcg by mouth daily.     Allergies:   Hydrocodone   Social History   Socioeconomic History   Marital status: Married    Spouse name: Not on file   Number of children: Not on file   Years of education: Not on file   Highest education level: Not on file  Occupational History   Not on file  Tobacco Use   Smoking status: Never   Smokeless tobacco: Never  Substance and Sexual Activity   Alcohol use: Yes    Alcohol/week: 4.0 standard drinks of alcohol    Types: 4 Glasses of wine per week  Drug use: No   Sexual activity: Yes    Birth control/protection: Pill  Other Topics Concern   Not on file  Social History Narrative   Not on file   Social Determinants of Health   Financial Resource Strain: Not on file  Food Insecurity: Not on file  Transportation Needs: Not on file  Physical Activity: Not on file  Stress: Not on file  Social Connections: Not on file     Family History: The patient's family history includes Alcohol abuse in her father; Cerebral aneurysm in her father; Diabetes in her father; Hyperlipidemia in her mother; Hypertension in her mother; Obesity in her father, maternal grandmother, and mother; Other in her brother and father.  ROS:   Please see the history of present illness.     Difficulty sleeping.  Snores.  Excessive daytime sleepiness.  All other systems reviewed and are negative.  EKGs/Labs/Other Studies Reviewed:    The following studies were reviewed today: No cardiac imaging  EKG:  EKG EKG demonstrates vertical axis to rightward axis, P pulmonale, left atrial abnormality, poor R wave progression V1 through V5.  Recent Labs: No results found for requested labs within last 365 days.  Recent Lipid Panel No results found for: "CHOL", "TRIG", "HDL", "CHOLHDL", "VLDL", "LDLCALC", "LDLDIRECT"  Physical Exam:    VS:  BP 130/84   Pulse 89   Ht '5\' 6"'$  (1.676 m)   Wt 217 lb 3.2 oz (98.5 kg)   SpO2 96%   BMI 35.06 kg/m     Wt Readings from Last 3 Encounters:  07/10/22 217 lb 3.2 oz (98.5 kg)  03/20/16 198 lb (89.8 kg)  07/13/12 180 lb (81.6 kg)     GEN: Overweight. No acute distress HEENT: Normal NECK: No JVD. LYMPHATICS: No lymphadenopathy CARDIAC: No murmur. RRR no gallop, or edema. VASCULAR:  Normal Pulses. No bruits. RESPIRATORY:  Clear to auscultation without rales, wheezing or rhonchi  ABDOMEN: Soft, non-tender, non-distended, No pulsatile mass, MUSCULOSKELETAL: No deformity  SKIN: Warm and dry NEUROLOGIC:  Alert and oriented x 3 PSYCHIATRIC:  Normal affect   ASSESSMENT:    1. Nonspecific abnormal electrocardiogram (ECG) (EKG)   2. Primary hypertension   3. Hyperlipidemia LDL goal <70   4. Prediabetes   5. Family history of early CAD   67. Snoring    PLAN:    In order of problems listed above:  Biatrial abnormality with rightward axis raises a question of pulmonary hypertension either primary or secondary.  2D Doppler echocardiogram to assess right heart and if possible get some sense of pulmonary artery pressure. Good blood pressure control on losartan. Lipids with most recent LDL 111 is not being treated.  We will get a coronary calcium score to determine if therapy is needed. She is being treated to lower her hemoglobin  A1c. Overall, has excessive daytime sleepiness, snoring, restless sleeping raising the question of sleep apnea.  This may correlate with the patient's abnormal EKG. She has excessive daytime sleepiness, she snores, and has evidence on her EKG of biatrial abnormality which could be the result of pulmonary hypertension.  Sleep study needs to be done to exclude obstructive sleep apnea.   Overall education and awareness concerning primar risk prevention was discussed in detail: LDL less than 70, hemoglobin A1c less than 7, blood pressure target less than 130/80 mmHg, >150 minutes of moderate aerobic activity per week, avoidance of smoking, weight control (via diet and exercise), and continued surveillance/management of/for obstructive sleep apnea.  Medication Adjustments/Labs and Tests Ordered: Current medicines are reviewed at length with the patient today.  Concerns regarding medicines are outlined above.  Orders Placed This Encounter  Procedures   CT CARDIAC SCORING (SELF PAY ONLY)   EKG 12-Lead   ECHOCARDIOGRAM COMPLETE   No orders of the defined types were placed in this encounter.   Patient Instructions  Medication Instructions:  Your physician recommends that you continue on your current medications as directed. Please refer to the Current Medication list given to you today.  *If you need a refill on your cardiac medications before your next appointment, please call your pharmacy*   Testing/Procedures: ECHO Your physician has requested that you have an echocardiogram. Echocardiography is a painless test that uses sound waves to create images of your heart. It provides your doctor with information about the size and shape of your heart and how well your heart's chambers and valves are working. This procedure takes approximately one hour. There are no restrictions for this procedure.   Calcium CT score Your physician has requested that you have a calcium score CT scan. There  is a $99 fee for the scan.     Follow-Up: At [facility], you and your health needs are our priority.  As part of our continuing mission to provide you with exceptional heart care, we have created designated Provider Care Teams.  These Care Teams include your primary Cardiologist (physician) and Advanced Practice Providers (APPs -  Physician Assistants and Nurse Practitioners) who all work together to provide you with the care you need, when you need it.  We recommend signing up for the patient portal called "MyChart".  Sign up information is provided on this After Visit Summary.  MyChart is used to connect with patients for Virtual Visits (Telemedicine).  Patients are able to view lab/test results, encounter notes, upcoming appointments, etc.  Non-urgent messages can be sent to your provider as well.   To learn more about what you can do with MyChart, go to [email or web address].    Your next appointment:   2 months  The format for your next appointment:   In Person  Provider:   Dr. Tamala Julian   Other Instructions  Sleep study at [facility] with script        Signed, Sinclair Grooms, MD  07/10/2022 5:14 PM    [place]

## 2022-07-24 ENCOUNTER — Ambulatory Visit (HOSPITAL_COMMUNITY): Payer: No Typology Code available for payment source | Attending: Cardiology

## 2022-07-24 DIAGNOSIS — R9431 Abnormal electrocardiogram [ECG] [EKG]: Secondary | ICD-10-CM | POA: Diagnosis present

## 2022-07-24 DIAGNOSIS — E785 Hyperlipidemia, unspecified: Secondary | ICD-10-CM | POA: Insufficient documentation

## 2022-07-24 DIAGNOSIS — I10 Essential (primary) hypertension: Secondary | ICD-10-CM | POA: Insufficient documentation

## 2022-07-24 DIAGNOSIS — Z8249 Family history of ischemic heart disease and other diseases of the circulatory system: Secondary | ICD-10-CM | POA: Insufficient documentation

## 2022-07-24 LAB — ECHOCARDIOGRAM COMPLETE
Area-P 1/2: 3.77 cm2
S' Lateral: 2.6 cm

## 2022-07-31 ENCOUNTER — Other Ambulatory Visit (HOSPITAL_COMMUNITY): Payer: No Typology Code available for payment source

## 2022-08-17 ENCOUNTER — Ambulatory Visit (HOSPITAL_BASED_OUTPATIENT_CLINIC_OR_DEPARTMENT_OTHER)
Admission: RE | Admit: 2022-08-17 | Discharge: 2022-08-17 | Disposition: A | Discharge status: Home / Self Care | Payer: No Typology Code available for payment source | Source: Ambulatory Visit | Attending: Interventional Cardiology | Admitting: Interventional Cardiology

## 2022-08-17 DIAGNOSIS — I10 Essential (primary) hypertension: Secondary | ICD-10-CM | POA: Insufficient documentation

## 2022-08-17 DIAGNOSIS — E785 Hyperlipidemia, unspecified: Secondary | ICD-10-CM | POA: Insufficient documentation

## 2022-08-17 DIAGNOSIS — R9431 Abnormal electrocardiogram [ECG] [EKG]: Secondary | ICD-10-CM | POA: Insufficient documentation

## 2022-08-17 DIAGNOSIS — Z8249 Family history of ischemic heart disease and other diseases of the circulatory system: Secondary | ICD-10-CM | POA: Insufficient documentation

## 2022-09-12 NOTE — Progress Notes (Signed)
Cardiology Office Note:    Date:  09/14/2022   ID:  Mirage Pfefferkorn, DOB 03-02-1971, MRN [id number]  PCP:  Mayra Neer, MD  Cardiologist:  None   Referring MD: Mayra Neer, MD   Chief Complaint  Patient presents with   Advice Only    Elevated CV risk: Prediabetes, overweight, hyperlipidemia, history of vascular disease   Follow-up    Abnormal EKG    History of Present Illness:    Miranda Hayden is a 51 y.o. female with a hx of GERD, primary hypertension, asthma, prediabetes, family history CAD, who is being seen for abnormal ECG.    No complaints.  Wanted to have a wrap-up discussion about the workup that was done.  Past Medical History:  Diagnosis Date   Arthritis    knees   Asthma    rarely uses inhaler   Depression    GERD (gastroesophageal reflux disease)    Glucose intolerance    HTN (hypertension)    Obesity    Prediabetes    Rhinitis    Seasonal allergies     Past Surgical History:  Procedure Laterality Date   DILATATION & CURETTAGE/HYSTEROSCOPY WITH MYOSURE N/A 04/14/2016   Procedure: DILATATION & CURETTAGE/HYSTEROSCOPY WITH MYOSURE;  Surgeon: Janyth Pupa, DO;  Location: [facility];  Service: Gynecology;  Laterality: N/A;  request case for Otila Kluver and Luellen Pucker and [facility]berta [facility]) confirmed    MYOMECTOMY     WISDOM TOOTH EXTRACTION      Current Medications: Current Meds  Medication Sig   albuterol (PROVENTIL HFA;VENTOLIN HFA) 108 (90 BASE) MCG/ACT inhaler Inhale 2 puffs into the lungs every 6 (six) hours as needed for wheezing or shortness of breath.    BIOTIN PO Take 1 tablet by mouth daily.   celecoxib (CELEBREX) 100 MG capsule Take 100 mg by mouth 2 (two) times daily as needed.   cetirizine (ZYRTEC) 10 MG tablet Take 10 mg by mouth as needed for allergies.   Cholecalciferol (VITAMIN D-3 PO) Take 1 capsule by mouth daily.   ibuprofen (ADVIL,MOTRIN) 800 MG tablet Take 800 mg by mouth every 8 (eight) hours as needed for moderate pain or cramping.     LORazepam (ATIVAN) 0.5 MG tablet Take 0.5 mg by mouth 2 (two) times daily as needed.   losartan (COZAAR) 50 MG tablet Take 50 mg by mouth daily.   MAGNESIUM OXIDE PO Take 400 mg by mouth daily at 6 (six) AM.   Multiple Vitamins-Minerals (MULTIVITAMIN PO) Take 1 tablet by mouth daily.   Semaglutide, 1 MG/DOSE, (OZEMPIC, 1 MG/DOSE,) 2 MG/1.5ML SOPN Inject 1 mg into the skin once a week.   TRINTELLIX 20 MG TABS tablet Take 20 mg by mouth daily.   vitamin B-12 (CYANOCOBALAMIN) 500 MCG tablet Take 500 mcg by mouth daily.     Allergies:   Hydrocodone   Social History   Socioeconomic History   Marital status: Married    Spouse name: Not on file   Number of children: Not on file   Years of education: Not on file   Highest education level: Not on file  Occupational History   Not on file  Tobacco Use   Smoking status: Never   Smokeless tobacco: Never  Substance and Sexual Activity   Alcohol use: Yes    Alcohol/week: 4.0 standard drinks of alcohol    Types: 4 Glasses of wine per week   Drug use: No   Sexual activity: Yes    Birth control/protection: Pill  Other Topics Concern  Not on file  Social History Narrative   Not on file   Social Determinants of Health   Financial Resource Strain: Not on file  Food Insecurity: Not on file  Transportation Needs: Not on file  Physical Activity: Not on file  Stress: Not on file  Social Connections: Not on file     Family History: The patient's family history includes Alcohol abuse in her father; Cerebral aneurysm in her father; Diabetes in her father; Hyperlipidemia in her mother; Hypertension in her mother; Obesity in her father, maternal grandmother, and mother; Other in her brother and father.  ROS:   Please see the history of present illness.    No complaints.  Was on call last night.  All other systems reviewed and are negative.  EKGs/Labs/Other Studies Reviewed:    The following studies were reviewed today: Coronary calcium  score 08/17/2022: IMPRESSION: Coronary calcium score of 0.  2D Doppler echocardiogram August 22, 2022: IMPRESSIONS   1. Left ventricular ejection fraction, by estimation, is 60 to 65%. Left  ventricular ejection fraction by 3D volume is 70 %. The left ventricle has  normal function. The left ventricle has no regional wall motion  abnormalities. Left ventricular diastolic   parameters were normal. The average left ventricular global longitudinal  strain is -23.2 %. The global longitudinal strain is normal.   2. Right ventricular systolic function is normal. The right ventricular  size is normal.   3. The mitral valve is normal in structure. No evidence of mitral valve  regurgitation. No evidence of mitral stenosis.   4. The aortic valve is normal in structure. Aortic valve regurgitation is  not visualized. No aortic stenosis is present.   5. The inferior vena cava is normal in size with greater than 50%  respiratory variability, suggesting right atrial pressure of 3 mmHg.   Comparison(s): No prior Echocardiogram.   EKG:  EKG performed 07/11/2022 demonstrates rightward axis and left atrial abnormality.  Recent Labs: No results found for requested labs within last 365 days.  Recent Lipid Panel No results found for: "CHOL", "TRIG", "HDL", "CHOLHDL", "VLDL", "LDLCALC", "LDLDIRECT"  Physical Exam:    VS:  BP 130/72   Pulse 94   Ht '5\' 6"'$  (1.676 m)   Wt 213 lb 6.4 oz (96.8 kg)   SpO2 100%   BMI 34.44 kg/m     Wt Readings from Last 3 Encounters:  09/14/22 213 lb 6.4 oz (96.8 kg)  07/10/22 217 lb 3.2 oz (98.5 kg)  03/20/16 198 lb (89.8 kg)     GEN: Slightly overweight. No acute distress HEENT: Normal NECK: No JVD. LYMPHATICS: No lymphadenopathy CARDIAC: Not examined.   No lower extremity edema.     ASSESSMENT:    1. Primary hypertension   2. Nonspecific abnormal electrocardiogram (ECG) (EKG)   3. Hyperlipidemia LDL goal <70   4. Prediabetes   5. Family history of  early CAD    PLAN:    In order of problems listed above:  Blood pressure is well-controlled on losartan 50 mg/day.  Target is 130/80 mmHg or less.  Low-salt diet and exercise encouraged. Letter cardiogram demonstrates rightward axis with atrial abnormality.  There is a possibility that she has sleep apnea.  She is scheduled to have a sleep study. With a calcium score of 0 I would recommend diet and exercise with weight loss at this time. Diet and exercise.   As needed follow-up with cardiology.   Medication Adjustments/Labs and Tests Ordered: Current medicines are  reviewed at length with the patient today.  Concerns regarding medicines are outlined above.  No orders of the defined types were placed in this encounter.  No orders of the defined types were placed in this encounter.   Patient Instructions  Medication Instructions:  Your physician recommends that you continue on your current medications as directed. Please refer to the Current Medication list given to you today.  *If you need a refill on your cardiac medications before your next appointment, please call your pharmacy*   Important Information About Sugar         Signed, Sinclair Grooms, MD  09/14/2022 12:51 PM    [place]

## 2022-09-13 ENCOUNTER — Ambulatory Visit: Payer: No Typology Code available for payment source | Admitting: Interventional Cardiology

## 2022-09-14 ENCOUNTER — Ambulatory Visit
Payer: No Typology Code available for payment source | Attending: Interventional Cardiology | Admitting: Interventional Cardiology

## 2022-09-14 ENCOUNTER — Encounter: Payer: Self-pay | Admitting: Interventional Cardiology

## 2022-09-14 VITALS — BP 130/72 | HR 94 | Ht 66.0 in | Wt 213.4 lb

## 2022-09-14 DIAGNOSIS — E785 Hyperlipidemia, unspecified: Secondary | ICD-10-CM | POA: Diagnosis not present

## 2022-09-14 DIAGNOSIS — R7303 Prediabetes: Secondary | ICD-10-CM | POA: Diagnosis not present

## 2022-09-14 DIAGNOSIS — I10 Essential (primary) hypertension: Secondary | ICD-10-CM

## 2022-09-14 DIAGNOSIS — Z8249 Family history of ischemic heart disease and other diseases of the circulatory system: Secondary | ICD-10-CM

## 2022-09-14 DIAGNOSIS — R9431 Abnormal electrocardiogram [ECG] [EKG]: Secondary | ICD-10-CM | POA: Diagnosis not present

## 2022-09-14 NOTE — Patient Instructions (Signed)
Medication Instructions:  Your physician recommends that you continue on your current medications as directed. Please refer to the Current Medication list given to you today.  *If you need a refill on your cardiac medications before your next appointment, please call your pharmacy*   Important Information About Sugar

## 2022-11-06 ENCOUNTER — Other Ambulatory Visit: Payer: Self-pay

## 2022-11-06 MED ORDER — OZEMPIC (1 MG/DOSE) 4 MG/3ML ~~LOC~~ SOPN
1.0000 mg | PEN_INJECTOR | SUBCUTANEOUS | 2 refills | Status: AC
  Filled 2022-11-06 (×3): qty 3, 28d supply, fill #0
  Filled 2023-04-18: qty 3, 28d supply, fill #1
  Filled 2023-05-14 – 2023-05-23 (×2): qty 3, 28d supply, fill #2

## 2022-11-23 ENCOUNTER — Other Ambulatory Visit: Payer: Self-pay

## 2022-11-23 MED ORDER — FLUTICASONE PROPIONATE HFA 44 MCG/ACT IN AERO
INHALATION_SPRAY | RESPIRATORY_TRACT | 1 refills | Status: AC
  Filled 2022-11-23: qty 10.6, 25d supply, fill #0

## 2022-11-23 MED ORDER — ALBUTEROL SULFATE HFA 108 (90 BASE) MCG/ACT IN AERS
INHALATION_SPRAY | RESPIRATORY_TRACT | 1 refills | Status: DC
  Filled 2022-11-23: qty 6.7, 25d supply, fill #0
  Filled 2023-04-18: qty 6.7, 25d supply, fill #1

## 2022-11-23 MED ORDER — MOUNJARO 5 MG/0.5ML ~~LOC~~ SOAJ
5.0000 mg | SUBCUTANEOUS | 1 refills | Status: AC
  Filled 2022-11-23: qty 2, 28d supply, fill #0
  Filled 2022-12-25: qty 2, 28d supply, fill #1

## 2022-11-24 ENCOUNTER — Other Ambulatory Visit: Payer: Self-pay

## 2022-12-20 ENCOUNTER — Other Ambulatory Visit: Payer: Self-pay | Admitting: Physician Assistant

## 2022-12-20 DIAGNOSIS — Z1231 Encounter for screening mammogram for malignant neoplasm of breast: Secondary | ICD-10-CM

## 2022-12-21 ENCOUNTER — Ambulatory Visit
Admission: RE | Admit: 2022-12-21 | Discharge: 2022-12-21 | Disposition: A | Discharge status: Home / Self Care | Payer: No Typology Code available for payment source | Source: Ambulatory Visit | Attending: Physician Assistant | Admitting: Physician Assistant

## 2022-12-21 DIAGNOSIS — Z1231 Encounter for screening mammogram for malignant neoplasm of breast: Secondary | ICD-10-CM

## 2022-12-26 ENCOUNTER — Other Ambulatory Visit: Payer: Self-pay

## 2023-01-04 ENCOUNTER — Other Ambulatory Visit: Payer: Self-pay

## 2023-01-04 MED ORDER — MOUNJARO 7.5 MG/0.5ML ~~LOC~~ SOAJ
SUBCUTANEOUS | 0 refills | Status: DC
  Filled 2023-01-04: qty 2, 28d supply, fill #0

## 2023-01-09 ENCOUNTER — Other Ambulatory Visit: Payer: Self-pay

## 2023-02-06 ENCOUNTER — Other Ambulatory Visit: Payer: Self-pay

## 2023-02-06 MED ORDER — MOUNJARO 7.5 MG/0.5ML ~~LOC~~ SOAJ
7.5000 mg | SUBCUTANEOUS | 3 refills | Status: DC
  Filled 2023-02-06: qty 2, 28d supply, fill #0

## 2023-02-26 ENCOUNTER — Other Ambulatory Visit: Payer: Self-pay

## 2023-02-26 MED ORDER — AMOXICILLIN-POT CLAVULANATE 875-125 MG PO TABS
1.0000 | ORAL_TABLET | Freq: Two times a day (BID) | ORAL | 0 refills | Status: AC
  Filled 2023-02-26: qty 14, 7d supply, fill #0

## 2023-03-01 ENCOUNTER — Other Ambulatory Visit: Payer: Self-pay

## 2023-03-01 MED ORDER — LOSARTAN POTASSIUM 50 MG PO TABS
50.0000 mg | ORAL_TABLET | Freq: Every day | ORAL | 0 refills | Status: DC
  Filled 2023-03-01: qty 90, 90d supply, fill #0

## 2023-03-01 MED ORDER — OZEMPIC (1 MG/DOSE) 4 MG/3ML ~~LOC~~ SOPN
1.0000 mg | PEN_INJECTOR | SUBCUTANEOUS | 3 refills | Status: AC
  Filled 2023-03-01: qty 3, 28d supply, fill #0
  Filled 2023-06-13 – 2023-07-09 (×3): qty 3, 28d supply, fill #1
  Filled 2023-07-31 – 2023-08-03 (×3): qty 3, 28d supply, fill #2

## 2023-03-07 ENCOUNTER — Other Ambulatory Visit: Payer: Self-pay

## 2023-03-09 ENCOUNTER — Other Ambulatory Visit: Payer: Self-pay

## 2023-03-09 MED ORDER — DUAVEE 0.45-20 MG PO TABS
1.0000 | ORAL_TABLET | Freq: Every day | ORAL | 11 refills | Status: DC
  Filled 2023-03-09: qty 30, 30d supply, fill #0
  Filled 2023-04-18: qty 30, 30d supply, fill #1
  Filled 2023-05-16 – 2023-05-23 (×2): qty 30, 30d supply, fill #2
  Filled 2023-06-15 – 2023-06-27 (×2): qty 30, 30d supply, fill #3
  Filled 2023-07-27: qty 30, 30d supply, fill #4
  Filled 2023-08-30: qty 30, 30d supply, fill #5
  Filled 2023-09-28: qty 30, 30d supply, fill #6
  Filled 2023-10-30: qty 30, 30d supply, fill #7
  Filled 2023-12-03: qty 30, 30d supply, fill #8
  Filled 2024-01-03: qty 30, 30d supply, fill #9
  Filled 2024-02-06: qty 30, 30d supply, fill #10
  Filled 2024-03-04: qty 30, 30d supply, fill #11

## 2023-03-13 ENCOUNTER — Other Ambulatory Visit: Payer: Self-pay

## 2023-03-14 ENCOUNTER — Other Ambulatory Visit: Payer: Self-pay

## 2023-03-14 MED ORDER — SUTAB 1479-225-188 MG PO TABS
ORAL_TABLET | ORAL | 0 refills | Status: AC
  Filled 2023-03-14: qty 24, 1d supply, fill #0

## 2023-03-20 ENCOUNTER — Other Ambulatory Visit: Payer: Self-pay

## 2023-04-18 ENCOUNTER — Other Ambulatory Visit: Payer: Self-pay

## 2023-04-20 ENCOUNTER — Other Ambulatory Visit: Payer: Self-pay

## 2023-04-23 ENCOUNTER — Other Ambulatory Visit: Payer: Self-pay

## 2023-05-10 ENCOUNTER — Other Ambulatory Visit: Payer: Self-pay

## 2023-05-10 MED ORDER — TRINTELLIX 20 MG PO TABS
20.0000 mg | ORAL_TABLET | Freq: Every day | ORAL | 3 refills | Status: DC
  Filled 2023-05-10 – 2023-05-16 (×5): qty 90, 90d supply, fill #0
  Filled 2023-05-21: qty 30, 30d supply, fill #0
  Filled 2023-05-21: qty 90, 90d supply, fill #0
  Filled 2023-08-14 – 2023-08-27 (×2): qty 90, 90d supply, fill #1
  Filled 2023-11-19 – 2023-11-20 (×2): qty 90, 90d supply, fill #2
  Filled 2024-02-18 – 2024-02-28 (×2): qty 90, 90d supply, fill #3

## 2023-05-10 MED ORDER — LORAZEPAM 0.5 MG PO TABS
0.5000 mg | ORAL_TABLET | Freq: Two times a day (BID) | ORAL | 2 refills | Status: AC | PRN
  Filled 2023-05-10 – 2023-07-27 (×3): qty 60, 30d supply, fill #0

## 2023-05-11 ENCOUNTER — Other Ambulatory Visit: Payer: Self-pay

## 2023-05-14 ENCOUNTER — Other Ambulatory Visit: Payer: Self-pay

## 2023-05-15 ENCOUNTER — Other Ambulatory Visit: Payer: Self-pay

## 2023-05-15 ENCOUNTER — Other Ambulatory Visit (HOSPITAL_COMMUNITY): Payer: Self-pay

## 2023-05-16 ENCOUNTER — Other Ambulatory Visit: Payer: Self-pay

## 2023-05-21 ENCOUNTER — Other Ambulatory Visit: Payer: Self-pay

## 2023-05-22 ENCOUNTER — Other Ambulatory Visit: Payer: Self-pay

## 2023-05-23 ENCOUNTER — Other Ambulatory Visit: Payer: Self-pay

## 2023-05-28 ENCOUNTER — Other Ambulatory Visit: Payer: Self-pay

## 2023-05-29 ENCOUNTER — Other Ambulatory Visit: Payer: Self-pay

## 2023-05-29 MED ORDER — LOSARTAN POTASSIUM 50 MG PO TABS
50.0000 mg | ORAL_TABLET | Freq: Every day | ORAL | 0 refills | Status: AC
  Filled 2023-05-29 – 2023-05-30 (×2): qty 90, 90d supply, fill #0

## 2023-05-30 ENCOUNTER — Other Ambulatory Visit: Payer: Self-pay

## 2023-06-13 ENCOUNTER — Other Ambulatory Visit: Payer: Self-pay

## 2023-06-20 ENCOUNTER — Other Ambulatory Visit: Payer: Self-pay

## 2023-06-21 ENCOUNTER — Other Ambulatory Visit: Payer: Self-pay

## 2023-06-22 ENCOUNTER — Other Ambulatory Visit: Payer: Self-pay

## 2023-06-26 ENCOUNTER — Other Ambulatory Visit: Payer: Self-pay

## 2023-06-27 ENCOUNTER — Other Ambulatory Visit: Payer: Self-pay

## 2023-06-28 ENCOUNTER — Other Ambulatory Visit: Payer: Self-pay

## 2023-06-28 MED ORDER — LOSARTAN POTASSIUM 50 MG PO TABS
50.0000 mg | ORAL_TABLET | Freq: Every day | ORAL | 0 refills | Status: AC
  Filled 2023-06-28: qty 90, 90d supply, fill #0

## 2023-07-09 ENCOUNTER — Other Ambulatory Visit: Payer: Self-pay

## 2023-07-10 ENCOUNTER — Other Ambulatory Visit: Payer: Self-pay

## 2023-07-10 MED ORDER — ALBUTEROL SULFATE HFA 108 (90 BASE) MCG/ACT IN AERS
2.0000 | INHALATION_SPRAY | RESPIRATORY_TRACT | 1 refills | Status: AC
  Filled 2023-07-10 – 2023-07-27 (×2): qty 6.7, 17d supply, fill #0
  Filled 2024-06-04: qty 6.7, 17d supply, fill #1

## 2023-07-19 ENCOUNTER — Other Ambulatory Visit: Payer: Self-pay

## 2023-07-27 ENCOUNTER — Other Ambulatory Visit: Payer: Self-pay

## 2023-07-31 ENCOUNTER — Other Ambulatory Visit: Payer: Self-pay

## 2023-08-01 ENCOUNTER — Other Ambulatory Visit: Payer: Self-pay

## 2023-08-02 ENCOUNTER — Other Ambulatory Visit: Payer: Self-pay

## 2023-08-02 MED ORDER — OZEMPIC (2 MG/DOSE) 8 MG/3ML ~~LOC~~ SOPN
2.0000 mg | PEN_INJECTOR | SUBCUTANEOUS | 2 refills | Status: AC
  Filled 2023-08-02: qty 3, 28d supply, fill #0
  Filled 2023-08-23 – 2023-08-27 (×2): qty 3, 28d supply, fill #1
  Filled 2023-09-20: qty 3, 28d supply, fill #2

## 2023-08-02 MED ORDER — LOSARTAN POTASSIUM 50 MG PO TABS
50.0000 mg | ORAL_TABLET | Freq: Every day | ORAL | 1 refills | Status: AC
  Filled 2023-08-02 – 2024-04-11 (×2): qty 90, 90d supply, fill #0
  Filled 2024-07-15: qty 90, 90d supply, fill #1

## 2023-08-03 ENCOUNTER — Other Ambulatory Visit: Payer: Self-pay

## 2023-08-14 ENCOUNTER — Other Ambulatory Visit: Payer: Self-pay

## 2023-08-23 ENCOUNTER — Other Ambulatory Visit: Payer: Self-pay

## 2023-08-24 ENCOUNTER — Other Ambulatory Visit: Payer: Self-pay

## 2023-08-27 ENCOUNTER — Other Ambulatory Visit: Payer: Self-pay

## 2023-08-30 ENCOUNTER — Other Ambulatory Visit: Payer: Self-pay

## 2023-08-31 ENCOUNTER — Other Ambulatory Visit: Payer: Self-pay

## 2023-09-20 ENCOUNTER — Other Ambulatory Visit: Payer: Self-pay

## 2023-09-20 MED ORDER — HYDROCORTISONE (PERIANAL) 2.5 % EX CREA
1.0000 | TOPICAL_CREAM | Freq: Two times a day (BID) | CUTANEOUS | 1 refills | Status: AC
  Filled 2023-09-20: qty 30, 7d supply, fill #0

## 2023-09-27 ENCOUNTER — Other Ambulatory Visit: Payer: Self-pay

## 2023-09-27 MED ORDER — LOSARTAN POTASSIUM 50 MG PO TABS
50.0000 mg | ORAL_TABLET | Freq: Every day | ORAL | 1 refills | Status: DC
  Filled 2023-09-27: qty 90, 90d supply, fill #0
  Filled 2024-01-03: qty 90, 90d supply, fill #1

## 2023-09-27 MED ORDER — OZEMPIC (2 MG/DOSE) 8 MG/3ML ~~LOC~~ SOPN
2.0000 mg | PEN_INJECTOR | SUBCUTANEOUS | 2 refills | Status: DC
  Filled 2023-09-27 – 2023-10-19 (×2): qty 3, 28d supply, fill #0
  Filled 2023-11-20 – 2023-11-21 (×2): qty 3, 28d supply, fill #1
  Filled 2023-12-18 – 2023-12-28 (×2): qty 3, 28d supply, fill #2

## 2023-09-28 ENCOUNTER — Other Ambulatory Visit: Payer: Self-pay

## 2023-10-19 ENCOUNTER — Other Ambulatory Visit: Payer: Self-pay

## 2023-10-30 ENCOUNTER — Other Ambulatory Visit: Payer: Self-pay

## 2023-10-31 ENCOUNTER — Other Ambulatory Visit: Payer: Self-pay

## 2023-11-02 ENCOUNTER — Other Ambulatory Visit: Payer: Self-pay

## 2023-11-19 ENCOUNTER — Other Ambulatory Visit: Payer: Self-pay

## 2023-11-20 ENCOUNTER — Other Ambulatory Visit: Payer: Self-pay

## 2023-11-21 ENCOUNTER — Other Ambulatory Visit: Payer: Self-pay

## 2023-11-26 ENCOUNTER — Other Ambulatory Visit: Payer: Self-pay

## 2023-12-03 ENCOUNTER — Other Ambulatory Visit: Payer: Self-pay

## 2023-12-04 ENCOUNTER — Other Ambulatory Visit: Payer: Self-pay

## 2023-12-18 ENCOUNTER — Other Ambulatory Visit: Payer: Self-pay

## 2023-12-27 ENCOUNTER — Other Ambulatory Visit: Payer: Self-pay

## 2023-12-28 ENCOUNTER — Other Ambulatory Visit: Payer: Self-pay

## 2024-01-03 ENCOUNTER — Other Ambulatory Visit: Payer: Self-pay

## 2024-02-06 ENCOUNTER — Other Ambulatory Visit: Payer: Self-pay

## 2024-02-18 ENCOUNTER — Other Ambulatory Visit: Payer: Self-pay

## 2024-02-27 ENCOUNTER — Other Ambulatory Visit: Payer: Self-pay

## 2024-02-28 ENCOUNTER — Other Ambulatory Visit: Payer: Self-pay

## 2024-03-04 ENCOUNTER — Other Ambulatory Visit: Payer: Self-pay

## 2024-03-05 ENCOUNTER — Other Ambulatory Visit: Payer: Self-pay

## 2024-03-05 MED ORDER — OZEMPIC (2 MG/DOSE) 8 MG/3ML ~~LOC~~ SOPN
2.0000 mg | PEN_INJECTOR | SUBCUTANEOUS | 2 refills | Status: DC
  Filled 2024-03-05: qty 3, 28d supply, fill #0
  Filled 2024-04-02 – 2024-04-11 (×2): qty 3, 28d supply, fill #1
  Filled 2024-05-08: qty 3, 28d supply, fill #2

## 2024-03-12 ENCOUNTER — Other Ambulatory Visit: Payer: Self-pay

## 2024-03-21 ENCOUNTER — Other Ambulatory Visit: Payer: Self-pay

## 2024-03-21 MED ORDER — METHOCARBAMOL 500 MG PO TABS
1000.0000 mg | ORAL_TABLET | ORAL | 0 refills | Status: DC
  Filled 2024-03-21: qty 120, 10d supply, fill #0

## 2024-03-21 MED ORDER — METHOCARBAMOL 500 MG PO TABS
1000.0000 mg | ORAL_TABLET | Freq: Four times a day (QID) | ORAL | 0 refills | Status: AC | PRN
  Filled 2024-03-21: qty 80, 10d supply, fill #0

## 2024-03-24 ENCOUNTER — Other Ambulatory Visit: Payer: Self-pay

## 2024-03-24 MED ORDER — CYCLOBENZAPRINE HCL 10 MG PO TABS
10.0000 mg | ORAL_TABLET | Freq: Three times a day (TID) | ORAL | 0 refills | Status: AC
  Filled 2024-03-24: qty 15, 5d supply, fill #0

## 2024-03-31 ENCOUNTER — Other Ambulatory Visit: Payer: Self-pay

## 2024-03-31 MED ORDER — DUAVEE 0.45-20 MG PO TABS
1.0000 | ORAL_TABLET | Freq: Every day | ORAL | 3 refills | Status: AC
  Filled 2024-03-31 – 2024-04-11 (×2): qty 30, 30d supply, fill #0
  Filled 2024-05-16: qty 30, 30d supply, fill #1
  Filled 2024-06-13: qty 30, 30d supply, fill #2
  Filled 2024-08-18: qty 30, 30d supply, fill #3

## 2024-04-02 ENCOUNTER — Other Ambulatory Visit: Payer: Self-pay

## 2024-04-09 ENCOUNTER — Other Ambulatory Visit: Payer: Self-pay

## 2024-04-11 ENCOUNTER — Other Ambulatory Visit: Payer: Self-pay

## 2024-04-17 ENCOUNTER — Other Ambulatory Visit: Payer: Self-pay

## 2024-04-30 ENCOUNTER — Other Ambulatory Visit: Payer: Self-pay

## 2024-04-30 MED ORDER — ALBUTEROL SULFATE HFA 108 (90 BASE) MCG/ACT IN AERS
2.0000 | INHALATION_SPRAY | RESPIRATORY_TRACT | 6 refills | Status: AC
  Filled 2024-04-30: qty 13.4, 34d supply, fill #0
  Filled 2024-06-23: qty 13.4, 34d supply, fill #1
  Filled 2024-07-28: qty 13.4, 34d supply, fill #2

## 2024-04-30 MED ORDER — TRINTELLIX 20 MG PO TABS
20.0000 mg | ORAL_TABLET | Freq: Every day | ORAL | 4 refills | Status: AC
  Filled 2024-05-26 – 2024-06-06 (×2): qty 90, 90d supply, fill #0

## 2024-04-30 MED ORDER — DUAVEE 0.45-20 MG PO TABS
1.0000 | ORAL_TABLET | Freq: Every day | ORAL | 11 refills | Status: AC
  Filled 2024-07-10: qty 30, 30d supply, fill #0

## 2024-05-01 ENCOUNTER — Other Ambulatory Visit: Payer: Self-pay

## 2024-05-08 ENCOUNTER — Other Ambulatory Visit: Payer: Self-pay

## 2024-05-16 ENCOUNTER — Other Ambulatory Visit: Payer: Self-pay

## 2024-05-26 ENCOUNTER — Other Ambulatory Visit: Payer: Self-pay

## 2024-05-30 ENCOUNTER — Other Ambulatory Visit: Payer: Self-pay

## 2024-06-04 ENCOUNTER — Other Ambulatory Visit: Payer: Self-pay

## 2024-06-06 ENCOUNTER — Other Ambulatory Visit: Payer: Self-pay

## 2024-06-13 ENCOUNTER — Other Ambulatory Visit: Payer: Self-pay

## 2024-06-18 ENCOUNTER — Other Ambulatory Visit: Payer: Self-pay

## 2024-06-19 ENCOUNTER — Encounter: Payer: Self-pay | Admitting: Pharmacist

## 2024-06-19 ENCOUNTER — Other Ambulatory Visit: Payer: Self-pay

## 2024-07-01 ENCOUNTER — Other Ambulatory Visit: Payer: Self-pay

## 2024-07-11 ENCOUNTER — Other Ambulatory Visit: Payer: Self-pay

## 2024-07-15 ENCOUNTER — Other Ambulatory Visit: Payer: Self-pay

## 2024-07-16 ENCOUNTER — Other Ambulatory Visit: Payer: Self-pay

## 2024-07-21 ENCOUNTER — Other Ambulatory Visit: Payer: Self-pay

## 2024-07-25 ENCOUNTER — Other Ambulatory Visit: Payer: Self-pay | Admitting: Physician Assistant

## 2024-07-25 DIAGNOSIS — Z1231 Encounter for screening mammogram for malignant neoplasm of breast: Secondary | ICD-10-CM

## 2024-07-28 ENCOUNTER — Other Ambulatory Visit: Payer: Self-pay

## 2024-07-28 MED ORDER — OZEMPIC (2 MG/DOSE) 8 MG/3ML ~~LOC~~ SOPN
2.0000 mg | PEN_INJECTOR | SUBCUTANEOUS | 2 refills | Status: AC
  Filled 2024-07-28: qty 9, 84d supply, fill #0
  Filled 2024-11-21: qty 9, 84d supply, fill #1

## 2024-07-29 ENCOUNTER — Other Ambulatory Visit: Payer: Self-pay

## 2024-07-30 ENCOUNTER — Other Ambulatory Visit: Payer: Self-pay

## 2024-07-31 ENCOUNTER — Ambulatory Visit

## 2024-07-31 ENCOUNTER — Other Ambulatory Visit: Payer: Self-pay

## 2024-07-31 ENCOUNTER — Ambulatory Visit
Admission: RE | Admit: 2024-07-31 | Discharge: 2024-07-31 | Disposition: A | Discharge status: Home / Self Care | Source: Ambulatory Visit | Attending: Physician Assistant | Admitting: Physician Assistant

## 2024-07-31 DIAGNOSIS — Z1231 Encounter for screening mammogram for malignant neoplasm of breast: Secondary | ICD-10-CM

## 2024-07-31 MED ORDER — TRINTELLIX 20 MG PO TABS
20.0000 mg | ORAL_TABLET | Freq: Every day | ORAL | 3 refills | Status: AC
  Filled 2024-08-30: qty 90, 90d supply, fill #0

## 2024-07-31 MED ORDER — LORAZEPAM 0.5 MG PO TABS
0.5000 mg | ORAL_TABLET | Freq: Two times a day (BID) | ORAL | 2 refills | Status: AC | PRN
  Filled 2024-07-31: qty 60, 30d supply, fill #0

## 2024-08-01 ENCOUNTER — Other Ambulatory Visit: Payer: Self-pay

## 2024-08-06 ENCOUNTER — Other Ambulatory Visit: Payer: Self-pay

## 2024-08-07 ENCOUNTER — Other Ambulatory Visit: Payer: Self-pay

## 2024-08-08 ENCOUNTER — Other Ambulatory Visit: Payer: Self-pay

## 2024-08-11 ENCOUNTER — Other Ambulatory Visit: Payer: Self-pay

## 2024-08-12 ENCOUNTER — Other Ambulatory Visit: Payer: Self-pay

## 2024-08-13 ENCOUNTER — Other Ambulatory Visit: Payer: Self-pay

## 2024-08-15 ENCOUNTER — Other Ambulatory Visit: Payer: Self-pay

## 2024-08-18 ENCOUNTER — Other Ambulatory Visit: Payer: Self-pay

## 2024-08-20 ENCOUNTER — Other Ambulatory Visit: Payer: Self-pay

## 2024-08-22 ENCOUNTER — Other Ambulatory Visit: Payer: Self-pay

## 2024-08-26 ENCOUNTER — Other Ambulatory Visit: Payer: Self-pay

## 2024-08-28 ENCOUNTER — Other Ambulatory Visit: Payer: Self-pay

## 2024-08-28 MED ORDER — OZEMPIC (2 MG/DOSE) 8 MG/3ML ~~LOC~~ SOPN
2.0000 mg | PEN_INJECTOR | SUBCUTANEOUS | 2 refills | Status: AC
  Filled 2024-08-28: qty 3, 28d supply, fill #0

## 2024-09-01 ENCOUNTER — Other Ambulatory Visit: Payer: Self-pay

## 2024-09-03 ENCOUNTER — Other Ambulatory Visit: Payer: Self-pay

## 2024-09-03 MED ORDER — TRIAMCINOLONE ACETONIDE 0.5 % EX OINT
1.0000 | TOPICAL_OINTMENT | Freq: Two times a day (BID) | CUTANEOUS | 0 refills | Status: AC
  Filled 2024-09-03: qty 15, 8d supply, fill #0

## 2024-09-03 MED ORDER — DUAVEE 0.45-20 MG PO TABS
1.0000 | ORAL_TABLET | Freq: Every day | ORAL | 1 refills | Status: AC
  Filled 2024-09-04 – 2024-09-10 (×2): qty 90, 90d supply, fill #0

## 2024-09-04 ENCOUNTER — Other Ambulatory Visit: Payer: Self-pay

## 2024-09-05 ENCOUNTER — Other Ambulatory Visit: Payer: Self-pay

## 2024-09-10 ENCOUNTER — Other Ambulatory Visit: Payer: Self-pay

## 2024-10-01 ENCOUNTER — Other Ambulatory Visit: Payer: Self-pay

## 2024-10-01 ENCOUNTER — Other Ambulatory Visit (HOSPITAL_COMMUNITY): Payer: Self-pay

## 2024-10-01 MED ORDER — CEFPODOXIME PROXETIL 200 MG PO TABS
200.0000 mg | ORAL_TABLET | Freq: Two times a day (BID) | ORAL | 0 refills | Status: AC
  Filled 2024-10-01 (×2): qty 14, 7d supply, fill #0

## 2024-10-13 ENCOUNTER — Other Ambulatory Visit: Payer: Self-pay

## 2024-10-13 MED ORDER — LOSARTAN POTASSIUM 50 MG PO TABS
50.0000 mg | ORAL_TABLET | Freq: Every day | ORAL | 1 refills | Status: AC
  Filled 2024-10-13: qty 90, 90d supply, fill #0

## 2024-10-20 ENCOUNTER — Other Ambulatory Visit: Payer: Self-pay

## 2024-11-21 ENCOUNTER — Other Ambulatory Visit: Payer: Self-pay
